# Patient Record
Sex: Female | Born: 1935
Health system: Southern US, Community
[De-identification: ages and names within clinical notes are randomized; demographics above are authoritative.]

## PROBLEM LIST (undated history)

## (undated) DIAGNOSIS — K589 Irritable bowel syndrome without diarrhea: Secondary | ICD-10-CM

## (undated) DIAGNOSIS — M419 Scoliosis, unspecified: Secondary | ICD-10-CM

## (undated) DIAGNOSIS — K5790 Diverticulosis of intestine, part unspecified, without perforation or abscess without bleeding: Secondary | ICD-10-CM

## (undated) DIAGNOSIS — I10 Essential (primary) hypertension: Secondary | ICD-10-CM

## (undated) DIAGNOSIS — E669 Obesity, unspecified: Secondary | ICD-10-CM

## (undated) DIAGNOSIS — M1990 Unspecified osteoarthritis, unspecified site: Secondary | ICD-10-CM

## (undated) DIAGNOSIS — H9190 Unspecified hearing loss, unspecified ear: Secondary | ICD-10-CM

## (undated) DIAGNOSIS — M8580 Other specified disorders of bone density and structure, unspecified site: Secondary | ICD-10-CM

## (undated) DIAGNOSIS — K219 Gastro-esophageal reflux disease without esophagitis: Secondary | ICD-10-CM

## (undated) HISTORY — PX: NISSEN FUNDOPLICATION: SHX2091

## (undated) HISTORY — PX: ABDOMINAL HERNIA REPAIR: SHX539

## (undated) HISTORY — PX: TOTAL ABDOMINAL HYSTERECTOMY: SHX209

## (undated) HISTORY — DX: Irritable bowel syndrome, unspecified: K58.9

## (undated) HISTORY — DX: Unspecified osteoarthritis, unspecified site: M19.90

## (undated) HISTORY — DX: Other specified disorders of bone density and structure, unspecified site: M85.80

## (undated) HISTORY — DX: Gastro-esophageal reflux disease without esophagitis: K21.9

## (undated) HISTORY — DX: Diverticulosis of intestine, part unspecified, without perforation or abscess without bleeding: K57.90

## (undated) HISTORY — DX: Essential (primary) hypertension: I10

## (undated) HISTORY — PX: CATARACT EXTRACTION: SUR2

## (undated) HISTORY — DX: Scoliosis, unspecified: M41.9

## (undated) HISTORY — PX: BREAST BIOPSY: SHX20

## (undated) HISTORY — DX: Obesity, unspecified: E66.9

## (undated) HISTORY — PX: APPENDECTOMY: SHX54

## (undated) HISTORY — DX: Unspecified hearing loss, unspecified ear: H91.90

## (undated) HISTORY — PX: COLON RESECTION: SHX5231

## (undated) HISTORY — PX: HEMORRHOID SURGERY: SHX153

---

## 1998-08-04 ENCOUNTER — Encounter: Payer: Self-pay | Admitting: Family Medicine

## 1998-09-16 ENCOUNTER — Ambulatory Visit (HOSPITAL_COMMUNITY): Admission: RE | Admit: 1998-09-16 | Discharge: 1998-09-16 | Payer: Self-pay | Admitting: Family Medicine

## 1998-09-16 ENCOUNTER — Encounter: Payer: Self-pay | Admitting: Cardiology

## 1998-10-03 ENCOUNTER — Inpatient Hospital Stay (HOSPITAL_COMMUNITY): Admission: RE | Admit: 1998-10-03 | Discharge: 1998-10-06 | Payer: Self-pay | Admitting: General Surgery

## 1999-03-04 ENCOUNTER — Other Ambulatory Visit: Admission: RE | Admit: 1999-03-04 | Discharge: 1999-03-04 | Payer: Self-pay | Admitting: Gynecology

## 1999-10-12 ENCOUNTER — Encounter: Admission: RE | Admit: 1999-10-12 | Discharge: 1999-10-12 | Payer: Self-pay | Admitting: *Deleted

## 1999-10-12 ENCOUNTER — Encounter: Payer: Self-pay | Admitting: *Deleted

## 1999-11-11 ENCOUNTER — Ambulatory Visit (HOSPITAL_COMMUNITY): Admission: RE | Admit: 1999-11-11 | Discharge: 1999-11-11 | Payer: Self-pay | Admitting: *Deleted

## 1999-11-11 ENCOUNTER — Encounter: Payer: Self-pay | Admitting: *Deleted

## 2000-03-04 ENCOUNTER — Other Ambulatory Visit: Admission: RE | Admit: 2000-03-04 | Discharge: 2000-03-04 | Payer: Self-pay | Admitting: Gynecology

## 2000-10-27 ENCOUNTER — Ambulatory Visit: Admission: RE | Admit: 2000-10-27 | Discharge: 2000-10-27 | Payer: Self-pay | Admitting: Family Medicine

## 2001-03-06 ENCOUNTER — Other Ambulatory Visit: Admission: RE | Admit: 2001-03-06 | Discharge: 2001-03-06 | Payer: Self-pay | Admitting: Gynecology

## 2001-03-20 ENCOUNTER — Ambulatory Visit (HOSPITAL_COMMUNITY): Admission: RE | Admit: 2001-03-20 | Discharge: 2001-03-20 | Payer: Self-pay | Admitting: Family Medicine

## 2001-03-20 ENCOUNTER — Encounter: Payer: Self-pay | Admitting: Family Medicine

## 2002-01-17 ENCOUNTER — Encounter: Payer: Self-pay | Admitting: Gynecology

## 2002-01-17 ENCOUNTER — Encounter: Admission: RE | Admit: 2002-01-17 | Discharge: 2002-01-17 | Payer: Self-pay | Admitting: Gynecology

## 2002-02-26 ENCOUNTER — Encounter: Payer: Self-pay | Admitting: Family Medicine

## 2002-02-26 ENCOUNTER — Ambulatory Visit (HOSPITAL_COMMUNITY): Admission: RE | Admit: 2002-02-26 | Discharge: 2002-02-26 | Payer: Self-pay | Admitting: Family Medicine

## 2002-04-02 ENCOUNTER — Other Ambulatory Visit: Admission: RE | Admit: 2002-04-02 | Discharge: 2002-04-02 | Payer: Self-pay | Admitting: Gynecology

## 2002-04-19 ENCOUNTER — Encounter (INDEPENDENT_AMBULATORY_CARE_PROVIDER_SITE_OTHER): Payer: Self-pay | Admitting: Specialist

## 2002-04-19 ENCOUNTER — Ambulatory Visit (HOSPITAL_COMMUNITY): Admission: RE | Admit: 2002-04-19 | Discharge: 2002-04-19 | Payer: Self-pay | Admitting: Gastroenterology

## 2002-06-11 ENCOUNTER — Encounter: Payer: Self-pay | Admitting: Gastroenterology

## 2002-06-11 ENCOUNTER — Encounter: Admission: RE | Admit: 2002-06-11 | Discharge: 2002-06-11 | Payer: Self-pay | Admitting: Gastroenterology

## 2002-06-13 ENCOUNTER — Ambulatory Visit (HOSPITAL_COMMUNITY): Admission: RE | Admit: 2002-06-13 | Discharge: 2002-06-13 | Payer: Self-pay | Admitting: Gastroenterology

## 2002-06-13 ENCOUNTER — Encounter (INDEPENDENT_AMBULATORY_CARE_PROVIDER_SITE_OTHER): Payer: Self-pay | Admitting: Specialist

## 2003-02-12 ENCOUNTER — Encounter: Admission: RE | Admit: 2003-02-12 | Discharge: 2003-02-12 | Payer: Self-pay | Admitting: Gynecology

## 2003-03-12 ENCOUNTER — Ambulatory Visit (HOSPITAL_COMMUNITY): Admission: RE | Admit: 2003-03-12 | Discharge: 2003-03-12 | Payer: Self-pay | Admitting: Family Medicine

## 2003-07-03 ENCOUNTER — Ambulatory Visit (HOSPITAL_COMMUNITY): Admission: RE | Admit: 2003-07-03 | Discharge: 2003-07-03 | Payer: Self-pay | Admitting: Family Medicine

## 2004-05-06 ENCOUNTER — Ambulatory Visit (HOSPITAL_COMMUNITY): Admission: RE | Admit: 2004-05-06 | Discharge: 2004-05-06 | Payer: Self-pay | Admitting: Gastroenterology

## 2004-05-06 ENCOUNTER — Encounter (INDEPENDENT_AMBULATORY_CARE_PROVIDER_SITE_OTHER): Payer: Self-pay | Admitting: Specialist

## 2005-03-25 ENCOUNTER — Other Ambulatory Visit: Admission: RE | Admit: 2005-03-25 | Discharge: 2005-03-25 | Payer: Self-pay | Admitting: Gynecology

## 2006-03-29 ENCOUNTER — Encounter: Admission: RE | Admit: 2006-03-29 | Discharge: 2006-03-29 | Payer: Self-pay | Admitting: Gynecology

## 2006-07-16 ENCOUNTER — Observation Stay (HOSPITAL_COMMUNITY): Admission: EM | Admit: 2006-07-16 | Discharge: 2006-07-17 | Payer: Self-pay | Admitting: Emergency Medicine

## 2007-04-13 ENCOUNTER — Encounter: Admission: RE | Admit: 2007-04-13 | Discharge: 2007-04-13 | Payer: Self-pay | Admitting: Family Medicine

## 2008-04-15 ENCOUNTER — Encounter: Admission: RE | Admit: 2008-04-15 | Discharge: 2008-04-15 | Payer: Self-pay | Admitting: Family Medicine

## 2009-04-16 ENCOUNTER — Encounter: Admission: RE | Admit: 2009-04-16 | Discharge: 2009-04-16 | Payer: Self-pay | Admitting: Family Medicine

## 2009-05-01 ENCOUNTER — Encounter: Admission: RE | Admit: 2009-05-01 | Discharge: 2009-05-01 | Payer: Self-pay | Admitting: Family Medicine

## 2009-06-26 ENCOUNTER — Encounter: Admission: RE | Admit: 2009-06-26 | Discharge: 2009-06-26 | Payer: Self-pay | Admitting: Family Medicine

## 2010-04-12 ENCOUNTER — Encounter: Payer: Self-pay | Admitting: Family Medicine

## 2010-08-07 NOTE — Op Note (Signed)
NAME:  Connie Obrien, Connie Obrien               ACCOUNT NO.:  [id number]   MEDICAL RECORD NO.:  [id number]          PATIENT TYPE:  AMB   LOCATION:  ENDO                         FACILITY:  [facility]   PHYSICIAN:  Anselmo Rod, M.D.  DATE OF BIRTH:  Nov 26, 1919   DATE OF PROCEDURE:  05/06/2004  DATE OF DISCHARGE:                                 OPERATIVE REPORT   PROCEDURE PERFORMED:  Colonoscopy with snare polypectomy x1 and cold  biopsies x3.   ENDOSCOPIST:  Anselmo Rod, M.D.   INSTRUMENT USED:  Olympus video colonoscope.   INDICATION FOR PROCEDURE:  An 77 year old white female with a history of  left lower quadrant pain and guaiac-positive stools.  Rule out colonic  polyps, masses, etc.  The patient has had a sigmoid colectomy for  complicated diverticulitis.   PREPROCEDURE PREPARATION:  Informed consent was procured from the patient.  The patient was fasted for eight hours prior to the procedure and prepped  with a bottle of magnesium citrate and a gallon of GoLYTELY the night prior  to the procedure.  The risks and benefits of the procedure, including a 10%  miss rate of cancer and polyps, were discussed with the patient as well.   PREPROCEDURE PHYSICAL:  VITAL SIGNS:  The patient had stable vital signs.  NECK:  Supple.  CHEST:  Clear to auscultation.  S1, S2 regular.  ABDOMEN:  Obese with left lower quadrant tenderness on palpation with  guarding, no rebound or rigidity.  No hepatosplenomegaly.   DESCRIPTION OF PROCEDURE:  The patient was placed in the left lateral  decubitus position and sedated with 50 mg of Demerol and 5 mg of Versed.  Once the patient was adequately sedate and maintained on low-flow oxygen and  continuous cardiac monitoring, the Olympus video colonoscope was advanced  from the rectum to the cecum.  A healthy anastomosis was noted in the left  colon at about 20 cm.  There was evidence of pandiverticulosis with  diverticula of varying sizes throughout the colon.   A small polyp was snared  from the distal right colon but was lost in the stool.  There was a  significant amount of residual stool in the colon.  Three small sessile  polyps were biopsied from the cecum (cold biopsies).  Small internal  hemorrhoids were seen on retroflexion.  The patient tolerated the procedure  well  without immediate complications.   IMPRESSION:  1.  Pandiverticulosis with a healthy anastomosis at 20 cm.  2.  Three small sessile polyps biopsied from the cecum and another polyp      snared from distal right colon.  The polyp that was snared was lost in      stool.  3.  Small internal hemorrhoids.  4.  A large amount of residual stool in the colon, small lesions could have      been missed.   RECOMMENDATIONS:  1.  Await pathology results.  2.  Avoid all nonsteroidals, including aspirin, for the next four weeks.  3.  Outpatient follow-up in the next two weeks for further recommendations.  4.  A high-fiber diet and brochures on diverticulosis are being given to the      patient for her education.      JNM/MEDQ  D:  05/06/2004  T:  05/06/2004  Job:  [id number]   cc:   Stacie Acres. White, M.D.  [address]  [place]  [place] [address]  Fax: [phone]

## 2010-08-07 NOTE — Op Note (Signed)
   NAME:  Brunelli, Doninique E                         ACCOUNT NO.:  [id number]   MEDICAL RECORD NO.:  [id number]                   PATIENT TYPE:  AMB   LOCATION:  ENDO                                 FACILITY:  [facility]   PHYSICIAN:  Anselmo Rod, M.D.               DATE OF BIRTH:  04-03-1919   DATE OF PROCEDURE:  06/13/2002  DATE OF DISCHARGE:                                 OPERATIVE REPORT   ADDENDUM:  Mrs. Hartwell had severe gastritis on examination of the stomach.  Retroflexion of the high cardia revealed evidence of some surgical changes  at the GE junction, question fundoplication (however, no definite  information on the surgical procedure available to me at this time).                                                Anselmo Rod, M.D.    JNM/MEDQ  D:  06/13/2002  T:  06/14/2002  Job:  [id number]

## 2010-08-07 NOTE — Op Note (Signed)
NAME:  Ketchem, Joeanne E                         ACCOUNT NO.:  [id number]   MEDICAL RECORD NO.:  [id number]                   PATIENT TYPE:  AMB   LOCATION:  ENDO                                 FACILITY:  [facility]   PHYSICIAN:  Anselmo Rod, M.D.               DATE OF BIRTH:  Nov 09, 1919   DATE OF PROCEDURE:  04/19/2002  DATE OF DISCHARGE:                                 OPERATIVE REPORT   PROCEDURE:  Colonoscopy with hot biopsy x1.   ENDOSCOPIST:  Anselmo Rod, M.D.   INSTRUMENT USED:  Pediatric adjustable Olympus colonoscope.   INDICATION FOR PROCEDURE:  An 77 year old white female undergoing screening  colonoscopy.  The patient is status post sigmoid colectomy for complicated  diverticular disease done in the past.   PREPROCEDURE PREPARATION:  Informed consent was procured from the patient.  The patient was fasted for eight hours prior to the procedure and prepped  with a bottle of Miralax and Gatorade the night prior to the procedure.   PREPROCEDURE PHYSICAL:  VITAL SIGNS:  The patient had stable vital signs.  NECK:  Supple.  CHEST:  Clear to auscultation.  S1, S2 regular.  ABDOMEN:  Soft with normal bowel sounds.   DESCRIPTION OF PROCEDURE:  The patient was placed in the left lateral  decubitus position and sedated with 40 mg of Demerol and 4 mg of Versed  intravenously.  Once the patient was adequately sedate and maintained on low-  flow oxygen and continuous cardiac monitoring, the Olympus video colonoscope  was advanced from the rectum to the cecum with difficulty.  There was a  large amount of residual stool in the colon with some solid stool as well.  A healthy anastomosis was noted at 10 cm.  Small internal hemorrhoids were  seen on retroflexion.  There was scattered diverticular disease seen  proximal to the anastomosis.  A small sessile polyp was hot biopsied from  100 cm.   IMPRESSION:  1. Scattered diverticulosis (few diverticula).  2. Healthy anastomosis  at 10 cm.  3. Small, nonbleeding internal hemorrhoids on retroflexion.  4. Small polyp at 100 cm, removed by hot biopsy.  5. Large amount of residual stool in the colon.  Small lesions could have     been missed.   RECOMMENDATIONS:  1. Await pathology results.  2.     A high-fiber diet.  3. Outpatient follow-up on a p.r.n. basis.  4. Repeat colonoscopy depending on pathology report.                                               Anselmo Rod, M.D.    JNM/MEDQ  D:  04/19/2002  T:  04/19/2002  Job:  [id number]   cc:   Stacie Acres. White, M.D.  [phone]  [address]  [place]  [place] [address]  Fax: [phone]

## 2010-08-07 NOTE — Op Note (Signed)
NAME:  Connie Obrien, Connie Obrien                         ACCOUNT NO.:  [id number]   MEDICAL RECORD NO.:  [id number]                   PATIENT TYPE:  AMB   LOCATION:  ENDO                                 FACILITY:   PHYSICIAN:  Anselmo Rod, M.D.               DATE OF BIRTH:  January 08, 1920   DATE OF PROCEDURE:  06/13/2002  DATE OF DISCHARGE:                                 OPERATIVE REPORT   PROCEDURE:  Esophagogastroduodenoscopy with biopsies.   ENDOSCOPIST:  Charna Elizabeth, M.D.   INSTRUMENT USED:  Olympus video panendoscope.   INDICATIONS FOR PROCEDURE:  Dysphagia and a sense of choking in an 77-year-  old white female.  The patient has had some improvement of her symptoms with  b.i.d. PPI (Nexium).  Rule out esophageal stricture, ulcers, masses, etc.   PREPROCEDURE PREPARATION:  Informed consent was procured from the patient.  The patient was fasted for eight hours prior to the procedure.   PREPROCEDURE PHYSICAL:  VITAL SIGNS:  The patient had stable vital signs.  NECK:  Supple.  CHEST:  Clear to auscultation.  CARDIAC:  S1 and S2 regular.  ABDOMEN:  Soft with normal bowel sounds.   DESCRIPTION OF PROCEDURE:  The patient was placed in the left lateral  decubitus position and sedated with 40 mg of Demerol and 4 mg of Versed  intravenously.  Once the patient was adequately sedated, maintained on low  flow oxygen and continuous cardiac monitoring, the Olympus video  panendoscope was advance through the mouth piece over the tongue, into the  esophagus under direct vision.  There was no evidence of esophagitis.  Contractions did not seem to progress throughout the esophagus as would be  expected.  There seemed to be some element of dysmotility but on advancing  the scope into the stomach, there was evidence of severe gastritis  throughout the gastric mucosa with significant erythema.  Multiple biopsies  were done to rule out presence of H pylori forwarded to pathology.  These  changes  seemed to be very prominent in the proximal half of the stomach.  The antrum appeared normal.  At the duodenal bulb and the proximal  ___________ appeared normal as well.   IMPRESSION:  1. Normal appearing esophagus except for some element of dysmotility.  2. Severe gastritis in the proximal half of the stomach.  Biopsies done,     results pending.  3. Normal proximal small bowel.   RECOMMENDATIONS:  1. Continue Nexium b.i.d.  2.     Treat with antibiotics if H pylori present.  3. Esophageal manometry if dysphagia persists.  4. Outpatient followup in the next two weeks for further recommendations.  Anselmo Rod, M.D.    JNM/MEDQ  D:  06/13/2002  T:  06/14/2002  Job:  [id number]   cc:   Stacie Acres. White, M.D.  [address]  [place]  [place] [address]  Fax: [phone]

## 2010-08-07 NOTE — H&P (Signed)
NAME:  Connie Obrien, Connie Obrien               ACCOUNT NO.:  [id number]   MEDICAL RECORD NO.:  [id number]          PATIENT TYPE:  INP   LOCATION:  1825                         FACILITY:  [facility]   PHYSICIAN:  Kela Millin, M.D.DATE OF BIRTH:  1919-06-12   DATE OF ADMISSION:  07/16/2006  DATE OF DISCHARGE:                              HISTORY & PHYSICAL   CHIEF COMPLAINT:  Dyspnea on exertion and chest pain.   HISTORY OF PRESENT ILLNESS:  The patient is an 77 year old white female  with a past medical history significant for hypertension (?), GERD and  venous insufficiency, who presents with complaints of dyspnea on  exertion as well as chest discomfort that has worsened in the past one  week.  She states that for several years she has had these symptoms -  when she walks up any incline she has to stop to catch her breath and  she has been having a constant chest pressure for this same duration  that seems to have worsened, as well.  She admits to a cough that is  nonproductive and recently she has been having a lot of foamy sputum  production.  She denies nausea, vomiting or diaphoresis, and no  radiation.  She also denies fevers, dysuria, melena or diarrhea, and no  hematochezia.  Her chest discomfort is midsternal in location.   The patient was seen in the ER and a chest x-ray was negative for acute  infiltrates.  A hiatal hernia was noted.  Her point of care markers were  negative.  Her beta natriuretic peptide was noted to be within normal  limits at 53.  The patient also reports easy fatigability.  She denies  PND or orthopnea.  She has had some long-standing lower extremity edema  - had vein stripping done in the 1970's and has a history of varicose  veins, as well.   PAST MEDICAL HISTORY:  1. As stated above.  2. History of diverticulosis.  3. History of phlebitis.   MEDICATIONS:  1. Aspirin.  2. Claritin 10 mg daily.  3. Lasix 40 mg daily.  4. Protonix 40 mg daily.  5.  Xopenex.  6. Metoprolol 50 mg b.i.d.  7. Citracal calcium 2 b.i.d.  8. Vitamin D.  9. Normal saline spray p.r.n.  10.Astelin.  11.Asmanex Twisthaler 220 mcg.  12.Travatan 0.04 solution 1 drop bilaterally q.h.s.  13.Alphagan 0.1% solution 1 drop b.i.d.   ALLERGIES:  SULFA.   SOCIAL HISTORY:  She denies tobacco.  She also denies alcohol.   FAMILY HISTORY:  Her mother is deceased; had a MI at age 38.   REVIEW OF SYSTEMS:  As per HPI; other review of systems is negative.   PHYSICAL EXAMINATION:  GENERAL:  The patient is a pleasant, elderly,  obese white female.  She is alert and oriented, in no apparent distress.  VITAL SIGNS:  Blood pressure is 129/65, initially was 170/92.  Her pulse  is 69, respiratory rate is 20 and O2 sat is 97%.  HEENT:  PERRL, EOMI.  Moist mucous membranes.  No oral exudates.  NECK:  Supple.  No  adenopathy.  No JVD.  No thyromegaly.  LUNGS:  Clear to auscultation bilaterally.  No crackles or wheezes.  CARDIOVASCULAR:  Regular rate and rhythm.  Normal S1 and S2.  No S3  appreciated.  ABDOMEN:  Obese.  Soft.  Bowel sounds present.  Nontender.  Nondistended.  No organomegaly and no masses palpable.  EXTREMITIES:  +1 to 2 edema.  No cyanosis.  NEUROLOGIC:  Alert and oriented times three.  Cranial nerves II through  XII are grossly intact.  Nonfocal exam.   LABORATORY DATA:  Chest x-ray - cardiomegaly, no acute abnormality,  hiatal hernia noted, and elevated left hemidiaphragm.   Beta natriuretic peptide is 53.  Point of care markers are negative  times one.  Sodium is 143, potassium is 4.5, chloride is 105, CO2 is 32,  glucose is 97, BUN is 12 and creatinine is 0.91.  Hemoglobin is 14.6,  hematocrit is 43 and white cell count  is pending.   ASSESSMENT AND PLAN:  1. Chest pain/dyspnea on exertion - as discussed above, will obtain      serial cardiac enzymes and D-dimer, and follow.  Will place the      patient on aspirin, nitrates and continue beta-blocker.   Will also      obtain a TSH and follow.  Will place the patient on a proton pump      inhibitor to cover for gastroesophageal reflux      disease/gastrointestinal - as noted above, patient with a hiatal      hernia on chest x-ray.  2. Bronchitis - as above, will place on antitussive/mucolytics.  She      is afebrile and chest x-ray is negative for acute infiltrates.  No      indication for antibiotics at this time.  Will follow and manage as      appropriate.  3. Hypertension - monitor blood pressure, continue metoprolol.  4. Venous insufficiency/peripheral edema - continue Lasix.  Patient      denies a history of congestive heart failure and her beta      natriuretic peptide is within normal limits at 53.  5. History of allergic rhinitis - continue outpatient medications.      Kela Millin, M.D.  Electronically Signed     ACV/MEDQ  D:  07/16/2006  T:  07/16/2006  Job:  [id number]   cc:   Stacie Acres. Cliffton Asters, M.D.

## 2010-08-25 ENCOUNTER — Encounter: Payer: Self-pay | Admitting: Internal Medicine

## 2010-08-26 ENCOUNTER — Ambulatory Visit (INDEPENDENT_AMBULATORY_CARE_PROVIDER_SITE_OTHER): Payer: Medicare Other | Admitting: Internal Medicine

## 2010-08-26 ENCOUNTER — Encounter: Payer: Self-pay | Admitting: Internal Medicine

## 2010-08-26 VITALS — BP 130/68 | HR 71 | Temp 97.4°F | Ht 61.0 in | Wt 199.2 lb

## 2010-08-26 DIAGNOSIS — R05 Cough: Secondary | ICD-10-CM

## 2010-08-26 DIAGNOSIS — R0989 Other specified symptoms and signs involving the circulatory and respiratory systems: Secondary | ICD-10-CM

## 2010-08-26 DIAGNOSIS — R059 Cough, unspecified: Secondary | ICD-10-CM

## 2010-08-26 DIAGNOSIS — R0600 Dyspnea, unspecified: Secondary | ICD-10-CM

## 2010-08-26 NOTE — Progress Notes (Signed)
Subjective:    Patient ID: Connie Obrien, female    DOB: Nov 13, 1919, 77 y.o.   MRN: [id number]  Cough This is a new (77 year old obese female. Says she has "hacking cough" all winter - since Oct 2011. Passive smoker +) problem. The current episode started more than 1 month ago (Started in Oct 2011). The problem has been waxing and waning (Waxing and waning. 4 episodes since Oct 2011 that are worrse. In between episodes, also has cough. Each episode last 1-2 weeks; most recent episode is thiese past 2 weeks but been getting better since 08/22/2010). The problem occurs constantly. The cough is productive of sputum (Episodes of cough are associated with yellow white sputum that is getting better.  Between episodes has only drry cough). Associated symptoms include heartburn, postnasal drip, a sore throat and shortness of breath. Pertinent negatives include no chest pain, chills, ear congestion, ear pain, eye redness, fever, headaches, hemoptysis, myalgias, nasal congestion, rash, rhinorrhea, sweats, weight loss or wheezing. Associated symptoms comments: Gags +, Regurgitates food +. Hiatal hernina - 2 surgeris 1917 and 1973 but CXR 04/2009 still shows it. Known GERD + with increased heartburn past 2 weeks; can taste food. Followed by Dr. Loreta Ave. Occ postnasal drip past 2 weeks. On 08/11/2010 PMD changed ACE inhibitor that she had been on for several years to ARB losartan but she so far has not noticed if this has helped but been only 2 weeks. Also, c/o dyspnea . Currently on fish oil for years too. The symptoms are aggravated by animals, lying down, pollens, fumes, exercise and dust (Triggered by reading newspaper, or reading any print, fumes, pollen, dust (known allergies),  cats, ). Risk factors for lung disease include smoking/tobacco exposure (Passive smoking via husband [phone]  when he diedd). She has tried prescription cough suppressant (zyrtec, flonase, astepro, PPI - helping somewhat only but not worse) for  the symptoms. The treatment provided mild relief. Her past medical history is significant for asthma and environmental allergies. There is no history of bronchiectasis, bronchitis, COPD, emphysema or pneumonia. diagnosed as asthma (occupational bleach exposure in 1971( in 1971. Dx superficial phlebitis but not DVT in 1970s  Shortness of Breath This is a new problem. The current episode started more than 1 year ago (few years). The problem occurs intermittently. The problem has been gradually worsening. Associated symptoms include leg swelling and a sore throat. Pertinent negatives include no chest pain, ear pain, fever, headaches, hemoptysis, rash, rhinorrhea, vomiting or wheezing. The symptoms are aggravated by exercise, pollens, odors, smoke, fumes, any activity and animal exposure (walking 100 feet produces dyspnea). Risk factors: obesity. She has tried nothing for the symptoms. The treatment provided no relief. Her past medical history is significant for allergies and asthma. There is no history of CAD, chronic lung disease, COPD, DVT, a heart failure, PE, pneumonia or a recent surgery. (Diagnosed as asthma (occupational bleach exposure in 1971( in 1971. Dx superficial phlebitis but not DVT in 1970s)   I also looked at CXR from 03/30/2010 report - confirms image of feb 2011 - left diaphragm elevation with hiatal hernia     Review of Systems  Constitutional: Negative for fever, chills, weight loss and unexpected weight change.  HENT: Positive for sore throat, sneezing, trouble swallowing and postnasal drip. Negative for ear pain, nosebleeds, congestion, rhinorrhea, dental problem and sinus pressure.   Eyes: Negative for redness and itching.  Respiratory: Positive for cough and shortness of breath. Negative for hemoptysis, chest tightness and wheezing.  Cardiovascular: Positive for leg swelling. Negative for chest pain and palpitations.  Gastrointestinal: Positive for heartburn. Negative for nausea  and vomiting.  Genitourinary: Negative for dysuria.  Musculoskeletal: Positive for joint swelling. Negative for myalgias.  Skin: Negative for rash.  Neurological: Negative for headaches.  Hematological: Positive for environmental allergies. Does not bruise/bleed easily.  Psychiatric/Behavioral: Negative for dysphoric mood. The patient is not nervous/anxious.        Objective:   Physical Exam  Vitals reviewed. Constitutional: She is oriented to person, place, and time. She appears well-developed and well-nourished. No distress.       obese  HENT:  Head: Normocephalic and atraumatic.  Right Ear: External ear normal.  Left Ear: External ear normal.  Mouth/Throat: Oropharynx is clear and moist. No oropharyngeal exudate.  Eyes: Conjunctivae and EOM are normal. Pupils are equal, round, and reactive to light. Right eye exhibits no discharge. Left eye exhibits no discharge. No scleral icterus.  Neck: Normal range of motion. Neck supple. No JVD present. No tracheal deviation present. No thyromegaly present.  Cardiovascular: Normal rate, regular rhythm, normal heart sounds and intact distal pulses.  Exam reveals no gallop and no friction rub.   No murmur heard. Pulmonary/Chest: Effort normal and breath sounds normal. No respiratory distress. She has no wheezes. She has no rales. She exhibits no tenderness.  Abdominal: Soft. Bowel sounds are normal. She exhibits no distension and no mass. There is no tenderness. There is no rebound and no guarding.  Musculoskeletal: Normal range of motion. She exhibits no edema and no tenderness.  Lymphadenopathy:    She has no cervical adenopathy.  Neurological: She is alert and oriented to person, place, and time. She has normal reflexes. No cranial nerve deficit. She exhibits normal muscle tone. Coordination normal.  Skin: Skin is warm and dry. No rash noted. She is not diaphoretic. No erythema. No pallor.  Psychiatric: She has a normal mood and affect. Her  behavior is normal. Judgment and thought content normal.          Assessment & Plan:

## 2010-08-26 NOTE — Patient Instructions (Addendum)
#  Cough is from multiple reasons - bp pill lisinopril, sinus, acid reflux, possibly asthma and possibly cyclical cough Stop fish oil Contact Dr Loreta Ave for hiatal hernia and acid reflux issues Finish prednisone tomorrow Have methacholine challenge test in 2-3 weeks #Shortness of breath  - my nurse will walk you for oxygen levels (reviewed after she left: noted she di #Followup  - after methacholine chaleng test

## 2010-08-28 ENCOUNTER — Encounter: Payer: Self-pay | Admitting: Internal Medicine

## 2010-08-28 DIAGNOSIS — R059 Cough, unspecified: Secondary | ICD-10-CM | POA: Insufficient documentation

## 2010-08-28 DIAGNOSIS — R05 Cough: Secondary | ICD-10-CM | POA: Insufficient documentation

## 2010-08-28 DIAGNOSIS — R0600 Dyspnea, unspecified: Secondary | ICD-10-CM | POA: Insufficient documentation

## 2010-08-28 NOTE — Assessment & Plan Note (Addendum)
Cough is from multiple reasons - bp pill lisinopril whose effect is still not worn off and typically needs 4 weeks to wear off, sinus, acid reflux, possibly asthma and possibly cyclical cough   Plan Need to give few more weeks to complete lisinopril wash out Needs to very importantly address possible GERD: Stop fish oil to avoid GERD precipiation and Contact Dr Loreta Ave for hiatal hernia and acid reflux issues Finish prednisone tomorrow Have methacholine challenge test in 2-3 weeks to rule out asthma

## 2010-08-28 NOTE — Assessment & Plan Note (Signed)
Probably multifactorial from obesity and possibly underlyng asthma made worse by lisinopril. Need to start workup by ruling in/out asthma with methacholine challenge test. Of note, she did not desaturate with exertion

## 2010-09-16 ENCOUNTER — Ambulatory Visit (HOSPITAL_COMMUNITY)
Admission: RE | Admit: 2010-09-16 | Discharge: 2010-09-16 | Disposition: A | Discharge status: Home / Self Care | Payer: Medicare Other | Source: Ambulatory Visit | Attending: Internal Medicine | Admitting: Internal Medicine

## 2010-09-16 DIAGNOSIS — R05 Cough: Secondary | ICD-10-CM

## 2010-09-16 DIAGNOSIS — R059 Cough, unspecified: Secondary | ICD-10-CM

## 2010-09-22 ENCOUNTER — Encounter: Payer: Self-pay | Admitting: Internal Medicine

## 2010-09-22 ENCOUNTER — Ambulatory Visit (INDEPENDENT_AMBULATORY_CARE_PROVIDER_SITE_OTHER): Payer: Medicare Other | Admitting: Internal Medicine

## 2010-09-22 VITALS — BP 122/70 | HR 88 | Temp 97.9°F | Ht 60.0 in | Wt 200.6 lb

## 2010-09-22 DIAGNOSIS — R0602 Shortness of breath: Secondary | ICD-10-CM

## 2010-09-22 DIAGNOSIS — R0600 Dyspnea, unspecified: Secondary | ICD-10-CM

## 2010-09-22 DIAGNOSIS — R05 Cough: Secondary | ICD-10-CM

## 2010-09-22 MED ORDER — AEROCHAMBER PLUS MISC: Status: AC

## 2010-09-22 NOTE — Progress Notes (Signed)
Subjective:    Patient ID: Connie Obrien, female    DOB: 1919/04/22, 77 y.o.   MRN: [id number]  HPI  OV 09/22/2010: Followup cough (lsinopril, gerd/hiatal hernia), and  dyspnea. Since last visit 08/26/2010 - has given > 4 weeks time for lisinopril to wash out . Also, stopped fish oil and saw Dr Loreta Ave on 09/21/2010: notes revied. She has contniued PPI but has been asked to stop VIt C and GAS X. With above, dry cough now 90% better. Unclear what aggravating or relieving factors for the remainder 10% of cough but denies sinus drainage. But methachioline challenge test 09/16/2010 (> 4 week ace inhibitor wwash out) is POSITIVE for ASthma (PD20 is 4). Dyspnea on exertion relieved by rest persists unchanged at class 2-3 levels. There are no other new associated features in interim.   Past Medical History  Diagnosis Date  . Obesity   . Osteoarthritis   . Scoliosis   . Glaucoma   . Deaf   . Allergic rhinitis   . Diverticulosis   . Asthma   . HTN (hypertension)   . Osteopenia   . GERD (gastroesophageal reflux disease)   . IBS (irritable bowel syndrome)      Family History  Problem Relation Age of Onset  . Coronary artery disease Father   . Diabetes Father   . Coronary artery disease Mother   . Coronary artery disease Brother   . Prostate cancer Brother     x2     History   Social History  . Marital Status: Widowed    Spouse Name: N/A    Number of Children: 8  . Years of Education: N/A   Occupational History  . retired from [employer]    Social History Main Topics  . Smoking status: Never Smoker   . Smokeless tobacco: Not on file  . Alcohol Use: No  . Drug Use: No  . Sexually Active: Not on file   Other Topics Concern  . Not on file   Social History Narrative  . No narrative on file     Allergies  Allergen Reactions  . Septra (Bactrim)   . Sulfa Antibiotics      Outpatient Prescriptions Prior to Visit  Medication Sig Dispense Refill  . aspirin 81 MG tablet Take 81  mg by mouth daily.        . Azelastine HCl (ASTEPRO) 0.15 % SOLN Place 2 puffs into the nose daily.        . benzonatate (TESSALON) 100 MG capsule Take 100 mg by mouth 3 (three) times daily as needed.        . bimatoprost (LUMIGAN) 0.03 % ophthalmic solution Place 1 drop into both eyes at bedtime.        . calcium citrate-vitamin D (CITRACAL+D) 315-200 MG-UNIT per tablet Take 1 tablet by mouth 2 (two) times daily.        . cefUROXime (CEFTIN) 500 MG tablet Take 500 mg by mouth 2 (two) times daily.        . cetirizine (ZYRTEC) 10 MG tablet Take 10 mg by mouth daily.        . Clobetasol Prop Crea-Coal Tar 0.05 & 2.3 % KIT Apply topically.        Marland Kitchen estradiol (ESTRACE) 0.1 MG/GM vaginal cream Place 2 g vaginally 2 (two) times a week.        . fluticasone (FLONASE) 50 MCG/ACT nasal spray Place 2 sprays into the nose daily.        Marland Kitchen  losartan-hydrochlorothiazide (HYZAAR) 100-25 MG per tablet Take 1 tablet by mouth daily.        . pantoprazole (PROTONIX) 40 MG tablet Take 40 mg by mouth daily.        . pravastatin (PRAVACHOL) 40 MG tablet Take 40 mg by mouth daily.        . timolol (BETIMOL) 0.5 % ophthalmic solution Place 1 drop into both eyes daily.        . vitamin D, CHOLECALCIFEROL, 400 UNITS tablet Take 400 Units by mouth daily.        . vitamin E 400 UNIT capsule Take 400 Units by mouth daily.        . Ascorbic Acid (VITAMIN C WITH ROSE HIPS) 100 MG tablet Take 100 mg by mouth daily.        . predniSONE (DELTASONE) 20 MG tablet Take 3 x3, then 2 x3, then 1 x 3.       . simethicone (MYLICON) 80 MG chewable tablet Chew 80 mg by mouth every 6 (six) hours as needed.           Review of Systems  Constitutional: Negative for fever and unexpected weight change.  HENT: Negative for ear pain, nosebleeds, congestion, sore throat, rhinorrhea, sneezing, trouble swallowing, dental problem, postnasal drip and sinus pressure.   Eyes: Negative for redness and itching.  Respiratory: Positive for cough.  Negative for chest tightness, shortness of breath and wheezing.   Cardiovascular: Negative for palpitations and leg swelling.  Gastrointestinal: Negative for nausea and vomiting.  Genitourinary: Negative for dysuria.  Musculoskeletal: Negative for joint swelling.  Skin: Negative for rash.  Neurological: Negative for headaches.  Hematological: Does not bruise/bleed easily.  Psychiatric/Behavioral: Negative for dysphoric mood. The patient is not nervous/anxious.        Objective:   Physical Exam        Assessment & Plan:  Vitals reviewed. Constitutional: She is oriented to person, place, and time. She appears well-developed and well-nourished. No distress.       obese  HENT:  Head: Normocephalic and atraumatic.  Right Ear: External ear normal.  Left Ear: External ear normal.  Mouth/Throat: Oropharynx is clear and moist. No oropharyngeal exudate.  Eyes: Conjunctivae and EOM are normal. Pupils are equal, round, and reactive to light. Right eye exhibits no discharge. Left eye exhibits no discharge. No scleral icterus.  Neck: Normal range of motion. Neck supple. No JVD present. No tracheal deviation present. No thyromegaly present.  Cardiovascular: Normal rate, regular rhythm, normal heart sounds and intact distal pulses.  Exam reveals no gallop and no friction rub.   No murmur heard. Pulmonary/Chest: Effort normal and breath sounds normal. No respiratory distress. She has no wheezes. She has no rales. She exhibits no tenderness.  Abdominal: Soft. Bowel sounds are normal. She exhibits no distension and no mass. There is no tenderness. There is no rebound and no guarding.  Musculoskeletal: Normal range of motion. She exhibits no edema and no tenderness.  Lymphadenopathy:    She has no cervical adenopathy.  Neurological: She is alert and oriented to person, place, and time. She has normal reflexes. No cranial nerve deficit. She exhibits normal muscle tone. Coordination normal.  Skin:  Skin is warm and dry. No rash noted. She is not diaphoretic. No erythema. No pallor.  Psychiatric: She has a normal mood and affect. Her behavior is normal. Judgment and thought content normal.

## 2010-09-22 NOTE — Patient Instructions (Addendum)
#  Cough is from multiple reasons - bp pill lisinopril (now stopped), sinus, acid reflux,  asthma and possibly cyclical cough - Glad you are 90% better with addressing acid reflux and bp pill issue. To get you over the remaining 10% - continue to follow all of Dr Kenna Gilbert advice for hiatal hernia and acid reflux issues - you have new diagnosis of  asthma: so start qvar 2 puff twice daily with spacer device -depending on response to above, we might have to start you cyclical cough protocol  #Shortness of breath  - refer to pulmonary rehabilitation for this which is due to obesity, deconditioning and asthma  - hopefully the inhaler QVAR helps you  #Followup  - 6- 8 weeks or sooner if needed - follow with Tammy my nurse for this followup above so she can do a medicine calendar for you as well

## 2010-09-24 ENCOUNTER — Encounter: Payer: Self-pay | Admitting: Internal Medicine

## 2010-09-24 NOTE — Assessment & Plan Note (Signed)
#  Cough is from multiple reasons - bp pill lisinopril (now stopped), sinus, acid reflux,  asthma and possibly cyclical cough - Glad you are 90% better with addressing acid reflux and bp pill issue. To get you over the remaining 10% - continue to follow all of Dr Kenna Gilbert advice for hiatal hernia and acid reflux issues - you have new diagnosis of  asthma: so start qvar 2 puff twice daily with spacer device -depending on response to above, we might have to start you cyclical cough protocol

## 2010-09-24 NOTE — Assessment & Plan Note (Signed)
#  Shortness of breath  - refer to pulmonary rehabilitation for this which is due to obesity, deconditioning and asthma  - hopefully the inhaler QVAR helps you

## 2010-09-29 ENCOUNTER — Ambulatory Visit (INDEPENDENT_AMBULATORY_CARE_PROVIDER_SITE_OTHER): Payer: Medicare Other | Admitting: Adult Health

## 2010-09-29 DIAGNOSIS — J45909 Unspecified asthma, uncomplicated: Secondary | ICD-10-CM | POA: Insufficient documentation

## 2010-09-29 DIAGNOSIS — R05 Cough: Secondary | ICD-10-CM

## 2010-09-29 MED ORDER — BECLOMETHASONE DIPROPIONATE 80 MCG/ACT IN AERS: 2.0000 | INHALATION_SPRAY | Freq: Two times a day (BID) | RESPIRATORY_TRACT | Status: DC

## 2010-09-29 NOTE — Assessment & Plan Note (Addendum)
Improved control on QVAR  And off ACE  Patient's medications were reviewed today and patient education was given. Computerized medication calendar was adjusted/completed  Plan:  Continue QVAR 2 puffs Twice daily  -brush/rinse after use follow up Dr. Marchelle Gearing  In 1 months and As needed

## 2010-09-29 NOTE — Patient Instructions (Addendum)
Follow med calendar closely and bring to each visit.  Continue QVAR 2 puffs Twice daily  -brush/rinse after use follow up Dr. Marchelle Gearing  In 1 months and As needed

## 2010-09-29 NOTE — Progress Notes (Signed)
  Subjective:    Patient ID: Connie Obrien, female    DOB: 06-29-1919, 77 y.o.   MRN: [id number]  HPI 77 yo female seen for initial consult for cough and dyspnea 08/26/10 with Dr. Marchelle Gearing .  ACE related cough w/ d/c of ACE and MCT + for Asthma started on QVAR   OV 09/22/2010: Followup cough (lsinopril, gerd/hiatal hernia), and  dyspnea. Since last visit 08/26/2010 - has given > 4 weeks time for lisinopril to wash out . Also, stopped fish oil and saw Dr Loreta Ave on 09/21/2010: notes revied. She has contniued PPI but has been asked to stop VIt C and GAS X. With above, dry cough now 90% better. Unclear what aggravating or relieving factors for the remainder 10% of cough but denies sinus drainage. But methachioline challenge test 09/16/2010 (> 4 week ace inhibitor wash out) is POSITIVE for ASthma (PD20 is 4). Dyspnea on exertion relieved by rest persists unchanged at class 2-3 levels. There are no other new associated features in interim.   09/29/2010 follow up and med review.  Pt returns for follow up and med review.   We reviewed all her meds and organized them into a med calendar with pt education . It appears she is keeping up with her meds .   Since last ov she is feeling better with decreased cough . " I am feeling much better ". Cough is almost gone.  Decreased dyspnea and wheezing. Taking QVAR without known difficulty.    Review of Systems Constitutional:   No  weight loss, night sweats,  Fevers, chills, fatigue, or  lassitude.  HEENT:   No headaches,  Difficulty swallowing,  Tooth/dental problems, or  Sore throat,                No sneezing, itching, ear ache, nasal congestion, post nasal drip,   CV:  No chest pain,  Orthopnea, PND, swelling in lower extremities, anasarca, dizziness, palpitations, syncope.   GI  No heartburn, indigestion, abdominal pain, nausea, vomiting, diarrhea, change in bowel habits, loss of appetite, bloody stools.   Resp:  No excess mucus, no productive cough,  No  non-productive cough,  No coughing up of blood.  No change in color of mucus.   .  No chest wall deformity  Skin: no rash or lesions.  GU: no dysuria, change in color of urine, no urgency or frequency.  No flank pain, no hematuria   MS:  No joint pain or swelling.  No decreased range of motion.   Psych:  No change in mood or affect. No depression or anxiety.            Objective:   Physical Exam GEN: A/Ox3; pleasant , NAD, elderly   HEENT:  [place]/AT,  EACs-clear, TMs-wnl, NOSE-clear, THROAT-clear, no lesions, no postnasal drip or exudate noted.   NECK:  Supple w/ fair ROM; no JVD; normal carotid impulses w/o bruits; no thyromegaly or nodules palpated; no lymphadenopathy.  RESP  Clear  P & A; w/o, wheezes/ rales/ or rhonchi.no accessory muscle use, no dullness to percussion  CARD:  RRR, no m/r/g  , no peripheral edema, pulses intact, no cyanosis or clubbing.  GI:   Soft & nt; nml bowel sounds; no organomegaly or masses detected.  Musco: Warm bil, no deformities or joint swelling noted.   Neuro: alert, no focal deficits noted.    Skin: Warm, no lesions or rashes         Assessment & Plan:

## 2010-10-02 ENCOUNTER — Other Ambulatory Visit: Payer: Self-pay | Admitting: *Deleted

## 2010-10-02 MED ORDER — CLOBETASOL PROP CREA-COAL TAR 0.05 & 2.3 % EX KIT: PACK | CUTANEOUS | Status: DC

## 2010-10-02 MED ORDER — UNABLE TO FIND: Status: DC

## 2010-10-02 MED ORDER — PROMISEB EX CREA: TOPICAL_CREAM | CUTANEOUS | Status: DC

## 2010-10-02 MED ORDER — TRIAMCINOLONE ACETONIDE 0.1 % EX CREA: TOPICAL_CREAM | Freq: Two times a day (BID) | CUTANEOUS | Status: AC

## 2010-10-02 MED ORDER — FLUTICASONE PROPIONATE 50 MCG/ACT NA SUSP: 2.0000 | Freq: Every day | NASAL | Status: DC | PRN

## 2010-10-02 MED ORDER — PRAVASTATIN SODIUM 40 MG PO TABS: 40.0000 mg | ORAL_TABLET | Freq: Every day | ORAL | Status: DC

## 2010-10-02 MED ORDER — POLYETHYL GLYCOL-PROPYL GLYCOL 0.4-0.3 % OP SOLN: 1.0000 [drp] | Freq: Two times a day (BID) | OPHTHALMIC | Status: DC

## 2010-10-02 MED ORDER — TIMOLOL HEMIHYDRATE 0.5 % OP SOLN: 1.0000 [drp] | Freq: Two times a day (BID) | OPHTHALMIC | Status: DC

## 2010-10-02 MED ORDER — PRO-FLORA PLUS PO CAPS: 1.0000 | ORAL_CAPSULE | Freq: Every day | ORAL | Status: DC

## 2010-10-02 NOTE — Progress Notes (Signed)
7.10.12 med calendar update.

## 2010-10-08 ENCOUNTER — Encounter: Payer: Self-pay | Admitting: Internal Medicine

## 2010-10-22 ENCOUNTER — Encounter (HOSPITAL_COMMUNITY): Payer: Medicare Other | Attending: Internal Medicine

## 2010-10-22 DIAGNOSIS — J45909 Unspecified asthma, uncomplicated: Secondary | ICD-10-CM | POA: Insufficient documentation

## 2010-10-22 DIAGNOSIS — R0989 Other specified symptoms and signs involving the circulatory and respiratory systems: Secondary | ICD-10-CM | POA: Insufficient documentation

## 2010-10-22 DIAGNOSIS — Z5189 Encounter for other specified aftercare: Secondary | ICD-10-CM | POA: Insufficient documentation

## 2010-10-22 DIAGNOSIS — R0609 Other forms of dyspnea: Secondary | ICD-10-CM | POA: Insufficient documentation

## 2010-10-22 DIAGNOSIS — R05 Cough: Secondary | ICD-10-CM | POA: Insufficient documentation

## 2010-10-22 DIAGNOSIS — R059 Cough, unspecified: Secondary | ICD-10-CM | POA: Insufficient documentation

## 2010-10-22 DIAGNOSIS — E669 Obesity, unspecified: Secondary | ICD-10-CM | POA: Insufficient documentation

## 2010-10-27 ENCOUNTER — Encounter (HOSPITAL_COMMUNITY): Payer: Medicare Other

## 2010-10-28 ENCOUNTER — Encounter: Payer: Self-pay | Admitting: Internal Medicine

## 2010-10-28 ENCOUNTER — Ambulatory Visit (INDEPENDENT_AMBULATORY_CARE_PROVIDER_SITE_OTHER): Payer: Medicare Other | Admitting: Internal Medicine

## 2010-10-28 VITALS — BP 126/78 | HR 91 | Temp 97.4°F | Ht 60.0 in | Wt 197.6 lb

## 2010-10-28 DIAGNOSIS — R05 Cough: Secondary | ICD-10-CM

## 2010-10-28 DIAGNOSIS — R0989 Other specified symptoms and signs involving the circulatory and respiratory systems: Secondary | ICD-10-CM

## 2010-10-28 DIAGNOSIS — R0600 Dyspnea, unspecified: Secondary | ICD-10-CM

## 2010-10-28 DIAGNOSIS — R0609 Other forms of dyspnea: Secondary | ICD-10-CM

## 2010-10-28 NOTE — Assessment & Plan Note (Signed)
Folloowup cough (lisinopril., gerd/hiatal hernia and asthma on methacholine challenge June 2012) . Almost resolved with qvar. Advised to call QVAR

## 2010-10-28 NOTE — Assessment & Plan Note (Signed)
Dyspnea (asthma, obesity, deconditioning).   Plan Continue qvar Continue rehab tthat was started yesterday rov 6 months

## 2010-10-28 NOTE — Patient Instructions (Signed)
#  Cough is from multiple reasons - bp pill lisinopril (now stopped), sinus, acid reflux,  asthma and possibly cyclical cough - Glad you are almost 100% better  with addressing acid reflux, bp pill issue and starting QVAR asthma inhaler - continue to follow all of Dr Kenna Gilbert advice for hiatal hernia and acid reflux issues - for athma: continue qvar 2 puff twice daily with spacer device  #Shortness of breath  - glad you are better - please continue pulmonary rehabilitation for this which is due to obesity, deconditioning and asthma   #Followup  - 6-  Months or sooner if needed

## 2010-10-28 NOTE — Progress Notes (Signed)
  Subjective:    Patient ID: Connie Obrien, female    DOB: April 04, 1919, 77 y.o.   MRN: [id number]  HPI  Folloowup cough (lisinopril., gerd/hiatal hernia and asthma on methacholine challenge June 2012) and dyspnea (asthma, obesity, deconditioning)  OV 10/28/2010: Since visit 1 month ago saw NP for med calendar. Using QVAR 2 puff bid. Says with this cough 100% gone almost. Compliant with inhaler. Notices occ mild dry cough when exposed to newspaper, crowds, heat or fire but not as ssevere as before. DYspnea is improved too but still notices when climbs uphill, relieved by rest. Started rehab yesterday; effect not known yet. Following associated gerd advice through Dr. Loreta Ave  Past, Fam. Social reviewed: no changes from 09/22/2010  Review of Systems  Constitutional: Negative for fever and unexpected weight change.  HENT: Negative for ear pain, nosebleeds, congestion, sore throat, rhinorrhea, sneezing, trouble swallowing, dental problem, postnasal drip and sinus pressure.   Eyes: Negative for redness and itching.  Respiratory: Negative for cough, chest tightness, shortness of breath and wheezing.   Cardiovascular: Negative for palpitations and leg swelling.  Gastrointestinal: Negative for nausea and vomiting.  Genitourinary: Negative for dysuria.  Musculoskeletal: Negative for joint swelling.  Skin: Negative for rash.  Neurological: Negative for headaches.  Hematological: Does not bruise/bleed easily.  Psychiatric/Behavioral: Negative for dysphoric mood. The patient is not nervous/anxious.        Objective:   Physical Exam Vitals reviewed. Constitutional: She is oriented to person, place, and time. She appears well-developed and well-nourished. No distress.       obese  HENT:  Head: Normocephalic and atraumatic.  Right Ear: External ear normal.  Left Ear: External ear normal.  Mouth/Throat: Oropharynx is clear and moist. No oropharyngeal exudate.  Eyes: Conjunctivae and EOM are normal.  Pupils are equal, round, and reactive to light. Right eye exhibits no discharge. Left eye exhibits no discharge. No scleral icterus.  Neck: Normal range of motion. Neck supple. No JVD present. No tracheal deviation present. No thyromegaly present.  Cardiovascular: Normal rate, regular rhythm, normal heart sounds and intact distal pulses.  Exam reveals no gallop and no friction rub.   No murmur heard. Pulmonary/Chest: Effort normal and breath sounds normal. No respiratory distress. She has no wheezes. She has no rales. She exhibits no tenderness.  Abdominal: Soft. Bowel sounds are normal. She exhibits no distension and no mass. There is no tenderness. There is no rebound and no guarding.  Musculoskeletal: Normal range of motion. She exhibits no edema and no tenderness.  Lymphadenopathy:    She has no cervical adenopathy.  Neurological: She is alert and oriented to person, place, and time. She has normal reflexes. No cranial nerve deficit. She exhibits normal muscle tone. Coordination normal.  Skin: Skin is warm and dry. No rash noted. She is not diaphoretic. No erythema. No pallor.  Psychiatric: She has a normal mood and affect. Her behavior is normal. Judgment and thought content normal.         Assessment & Plan:

## 2010-10-29 ENCOUNTER — Encounter (HOSPITAL_COMMUNITY)
Admission: RE | Admit: 2010-10-29 | Discharge: 2010-10-29 | Payer: Medicare Other | Source: Ambulatory Visit | Attending: Internal Medicine | Admitting: Internal Medicine

## 2010-11-03 ENCOUNTER — Encounter (HOSPITAL_COMMUNITY): Payer: Medicare Other

## 2010-11-05 ENCOUNTER — Encounter (HOSPITAL_COMMUNITY): Payer: Medicare Other

## 2010-11-10 ENCOUNTER — Encounter (HOSPITAL_COMMUNITY): Payer: Medicare Other

## 2010-11-12 ENCOUNTER — Encounter (HOSPITAL_COMMUNITY): Payer: Medicare Other

## 2010-11-17 ENCOUNTER — Encounter (HOSPITAL_COMMUNITY): Payer: Medicare Other

## 2010-11-19 ENCOUNTER — Encounter (HOSPITAL_COMMUNITY): Payer: Medicare Other

## 2010-11-24 ENCOUNTER — Encounter (HOSPITAL_COMMUNITY): Payer: Medicare Other | Attending: Internal Medicine

## 2010-11-24 DIAGNOSIS — E669 Obesity, unspecified: Secondary | ICD-10-CM | POA: Insufficient documentation

## 2010-11-24 DIAGNOSIS — R0609 Other forms of dyspnea: Secondary | ICD-10-CM | POA: Insufficient documentation

## 2010-11-24 DIAGNOSIS — Z5189 Encounter for other specified aftercare: Secondary | ICD-10-CM | POA: Insufficient documentation

## 2010-11-24 DIAGNOSIS — J45909 Unspecified asthma, uncomplicated: Secondary | ICD-10-CM | POA: Insufficient documentation

## 2010-11-24 DIAGNOSIS — R059 Cough, unspecified: Secondary | ICD-10-CM | POA: Insufficient documentation

## 2010-11-24 DIAGNOSIS — R05 Cough: Secondary | ICD-10-CM | POA: Insufficient documentation

## 2010-11-24 DIAGNOSIS — R0989 Other specified symptoms and signs involving the circulatory and respiratory systems: Secondary | ICD-10-CM | POA: Insufficient documentation

## 2010-11-26 ENCOUNTER — Encounter (HOSPITAL_COMMUNITY): Payer: Medicare Other

## 2010-12-01 ENCOUNTER — Encounter (HOSPITAL_COMMUNITY): Payer: Medicare Other

## 2010-12-03 ENCOUNTER — Encounter (HOSPITAL_COMMUNITY): Payer: Medicare Other

## 2010-12-08 ENCOUNTER — Encounter (HOSPITAL_COMMUNITY): Payer: Medicare Other

## 2010-12-10 ENCOUNTER — Encounter (HOSPITAL_COMMUNITY): Payer: Medicare Other

## 2010-12-15 ENCOUNTER — Encounter (HOSPITAL_COMMUNITY): Payer: Medicare Other

## 2010-12-17 ENCOUNTER — Encounter (HOSPITAL_COMMUNITY): Payer: Medicare Other

## 2010-12-22 ENCOUNTER — Encounter (HOSPITAL_COMMUNITY): Payer: Medicare Other | Attending: Internal Medicine

## 2010-12-22 DIAGNOSIS — J45909 Unspecified asthma, uncomplicated: Secondary | ICD-10-CM | POA: Insufficient documentation

## 2010-12-22 DIAGNOSIS — Z5189 Encounter for other specified aftercare: Secondary | ICD-10-CM | POA: Insufficient documentation

## 2010-12-22 DIAGNOSIS — R0609 Other forms of dyspnea: Secondary | ICD-10-CM | POA: Insufficient documentation

## 2010-12-22 DIAGNOSIS — E669 Obesity, unspecified: Secondary | ICD-10-CM | POA: Insufficient documentation

## 2010-12-22 DIAGNOSIS — R05 Cough: Secondary | ICD-10-CM | POA: Insufficient documentation

## 2010-12-22 DIAGNOSIS — R0989 Other specified symptoms and signs involving the circulatory and respiratory systems: Secondary | ICD-10-CM | POA: Insufficient documentation

## 2010-12-22 DIAGNOSIS — R059 Cough, unspecified: Secondary | ICD-10-CM | POA: Insufficient documentation

## 2010-12-24 ENCOUNTER — Encounter (HOSPITAL_COMMUNITY): Payer: Medicare Other

## 2010-12-29 ENCOUNTER — Encounter (HOSPITAL_COMMUNITY): Payer: Medicare Other

## 2010-12-31 ENCOUNTER — Encounter (HOSPITAL_COMMUNITY): Payer: Medicare Other

## 2011-01-05 ENCOUNTER — Encounter (HOSPITAL_COMMUNITY): Payer: Medicare Other

## 2011-01-07 ENCOUNTER — Encounter (HOSPITAL_COMMUNITY): Payer: Medicare Other

## 2011-01-12 ENCOUNTER — Encounter (HOSPITAL_COMMUNITY): Payer: Medicare Other

## 2011-01-14 ENCOUNTER — Encounter (HOSPITAL_COMMUNITY): Payer: Medicare Other

## 2011-01-19 ENCOUNTER — Encounter (HOSPITAL_COMMUNITY): Payer: Medicare Other

## 2011-01-21 ENCOUNTER — Encounter (HOSPITAL_COMMUNITY): Payer: Medicare Other

## 2011-01-26 ENCOUNTER — Encounter (HOSPITAL_COMMUNITY): Payer: Medicare Other

## 2011-01-28 ENCOUNTER — Encounter (HOSPITAL_COMMUNITY): Payer: Medicare Other

## 2011-02-02 ENCOUNTER — Encounter (HOSPITAL_COMMUNITY): Payer: Medicare Other

## 2011-02-04 ENCOUNTER — Encounter (HOSPITAL_COMMUNITY): Payer: Medicare Other

## 2011-02-10 ENCOUNTER — Other Ambulatory Visit: Payer: Self-pay | Admitting: Family Medicine

## 2011-02-10 ENCOUNTER — Ambulatory Visit (HOSPITAL_COMMUNITY)
Admission: RE | Admit: 2011-02-10 | Discharge: 2011-02-10 | Disposition: A | Discharge status: Home / Self Care | Payer: Medicare Other | Source: Ambulatory Visit | Attending: Family Medicine | Admitting: Family Medicine

## 2011-02-10 DIAGNOSIS — M7989 Other specified soft tissue disorders: Secondary | ICD-10-CM | POA: Insufficient documentation

## 2011-02-10 DIAGNOSIS — R52 Pain, unspecified: Secondary | ICD-10-CM

## 2011-02-10 DIAGNOSIS — R609 Edema, unspecified: Secondary | ICD-10-CM

## 2011-02-10 NOTE — Progress Notes (Signed)
*  PRELIMINARY RESULTS* Left lower extremity venous duplex has been completed.  Left leg is negative for acute deep and superficial vein thrombosis.   Vanna Scotland 02/10/2011, 6:19 PM

## 2011-03-30 DIAGNOSIS — J209 Acute bronchitis, unspecified: Secondary | ICD-10-CM | POA: Diagnosis not present

## 2011-03-30 DIAGNOSIS — B9789 Other viral agents as the cause of diseases classified elsewhere: Secondary | ICD-10-CM | POA: Diagnosis not present

## 2011-04-26 ENCOUNTER — Other Ambulatory Visit: Payer: Self-pay | Admitting: Family Medicine

## 2011-04-26 ENCOUNTER — Ambulatory Visit
Admission: RE | Admit: 2011-04-26 | Discharge: 2011-04-26 | Disposition: A | Discharge status: Home / Self Care | Payer: Medicare Other | Source: Ambulatory Visit | Attending: Family Medicine | Admitting: Family Medicine

## 2011-04-26 DIAGNOSIS — R05 Cough: Secondary | ICD-10-CM | POA: Diagnosis not present

## 2011-04-26 DIAGNOSIS — K449 Diaphragmatic hernia without obstruction or gangrene: Secondary | ICD-10-CM | POA: Diagnosis not present

## 2011-04-26 DIAGNOSIS — R0989 Other specified symptoms and signs involving the circulatory and respiratory systems: Secondary | ICD-10-CM | POA: Diagnosis not present

## 2011-04-26 DIAGNOSIS — Z1331 Encounter for screening for depression: Secondary | ICD-10-CM | POA: Diagnosis not present

## 2011-05-05 ENCOUNTER — Encounter: Payer: Self-pay | Admitting: Internal Medicine

## 2011-05-05 ENCOUNTER — Ambulatory Visit: Payer: Medicare Other | Admitting: Critical Care Medicine

## 2011-05-05 ENCOUNTER — Ambulatory Visit (INDEPENDENT_AMBULATORY_CARE_PROVIDER_SITE_OTHER): Payer: Medicare Other | Admitting: Internal Medicine

## 2011-05-05 VITALS — BP 126/86 | HR 89 | Temp 97.7°F | Ht 60.0 in | Wt 198.8 lb

## 2011-05-05 DIAGNOSIS — R05 Cough: Secondary | ICD-10-CM

## 2011-05-05 MED ORDER — BUDESONIDE-FORMOTEROL FUMARATE 80-4.5 MCG/ACT IN AERO: 2.0000 | INHALATION_SPRAY | Freq: Two times a day (BID) | RESPIRATORY_TRACT | Status: DC

## 2011-05-05 MED ORDER — PREDNISONE 10 MG PO TABS: ORAL_TABLET | ORAL | Status: DC

## 2011-05-05 NOTE — Progress Notes (Signed)
Subjective:    Patient ID: Connie Obrien, female    DOB: 1920-02-13, 78 y.o.   MRN: [id number]  HPI Folloowup cough (lisinopril., gerd/hiatal hernia and asthma on methacholine challenge June 2012) and dyspnea (asthma, obesity, deconditioning)  OV 10/28/2010: Since visit 1 month ago saw NP for med calendar. Using QVAR 2 puff bid. Says with this cough 100% gone almost. Compliant with inhaler. Notices occ mild dry cough when exposed to newspaper, crowds, heat or fire but not as ssevere as before. DYspnea is improved too but still notices when climbs uphill, relieved by rest. Started rehab yesterday; effect not known yet. Following associated gerd advice through Dr. Loreta Ave  Past, Fam. Social reviewed: no changes from 09/22/2010  REC #Cough is from multiple reasons - bp pill lisinopril (now stopped), sinus, acid reflux, asthma and possibly cyclical cough  - Glad you are almost 100% better with addressing acid reflux, bp pill issue and starting QVAR asthma inhaler  - continue to follow all of Dr Kenna Gilbert advice for hiatal hernia and acid reflux issues  - for athma: continue qvar 2 puff twice daily with spacer device  #Shortness of breath  - glad you are better  - please continue pulmonary rehabilitation for this which is due to obesity, deconditioning and asthma  #Followup  - 6- Months or sooner if needed  OV 05/05/2011 FU cough. Reports bronchitis on 03/30/11. Rx Zpak and it did not work. Replaced later with 'stronger antibiotic' and initially thought she improved but cough recurred/worsened a few days later. So, visited PMD. CXR done - reportedly clear. Then given ventolin 04/26/11  (says was wheezing real bad) which she says helped but she at her own discretion stopped QVAR because she did not want to take 2 inhalers and was causing some soreness.  At this point still with cough. Currently RSI score is high - is 35 (level 5 - hoarseness, excess mucus, annoying cough, sensation of lump in throat and  heart burn. Level 3 -clearing of throat. Level 4- cough after lying down, Level 3- choking episodes). She says even this high score is improved since Jan 2013. Currently cough is made worse by perfumes, damp weather, cold air, windy, going outside, bending and eating. Also, she notes food comes back after eating and makes her cough but she is able to swallow pills. IN association, feels there is something in throat all the time and there is something stuck there and some mild wheezing.  No wheezing  Past, Family, Social reviewed: no change since last visit other than in HPI  Review of Systems  Constitutional: Negative for fever and unexpected weight change.  HENT: Positive for sore throat. Negative for ear pain, nosebleeds, congestion, rhinorrhea, sneezing, trouble swallowing, dental problem, postnasal drip and sinus pressure.   Eyes: Negative for redness and itching.  Respiratory: Positive for cough and wheezing. Negative for chest tightness and shortness of breath.   Cardiovascular: Negative for palpitations and leg swelling.  Gastrointestinal: Negative for nausea and vomiting.  Genitourinary: Negative for dysuria.  Musculoskeletal: Negative for joint swelling.  Skin: Negative for rash.  Neurological: Negative for headaches.  Hematological: Does not bruise/bleed easily.  Psychiatric/Behavioral: Negative for dysphoric mood. The patient is not nervous/anxious.        Objective:   Physical Exam Vitals reviewed. Constitutional: She is oriented to person, place, and time. She appears well-developed and well-nourished. No distress.       obese , clears throat HENT:  Head: Normocephalic and atraumatic.  Right Ear: External ear normal.  Left Ear: External ear normal.  Mouth/Throat: Oropharynx is clear and moist. No oropharyngeal exudate.  Eyes: Conjunctivae and EOM are normal. Pupils are equal, round, and reactive to light. Right eye exhibits no discharge. Left eye exhibits no discharge. No  scleral icterus.  Neck: Normal range of motion. Neck supple. No JVD present. No tracheal deviation present. No thyromegaly present.  Cardiovascular: Normal rate, regular rhythm, normal heart sounds and intact distal pulses.  Exam reveals no gallop and no friction rub.   No murmur heard. Pulmonary/Chest: Effort normal and breath sounds normal. No respiratory distress. She has no wheezes. She has no rales. She exhibits no tenderness.  Abdominal: Soft. Bowel sounds are normal. She exhibits no distension and no mass. There is no tenderness. There is no rebound and no guarding.  Musculoskeletal: Normal range of motion. She exhibits no edema and no tenderness.  Lymphadenopathy:    She has no cervical adenopathy.  Neurological: She is alert and oriented to person, place, and time. She has normal reflexes. No cranial nerve deficit. She exhibits normal muscle tone. Coordination normal.  Skin: Skin is warm and dry. No rash noted. She is not diaphoretic. No erythema. No pallor.  Psychiatric: She has a normal mood and affect. Her behavior is normal. Judgment and thought content normal.           Assessment & Plan:

## 2011-05-05 NOTE — Assessment & Plan Note (Signed)
I think your cough is from sinus, hiatal hernia acid reflux and asthma  The asthma is acting up Acid reflux and Hiatal hernia are also likely acting up  PLAN Please take prednisone 40mg  once daily x 3 days, then 30mg  once daily x 3 days, then 20mg  once daily x 3 days, then prednisone 10mg  once daily  x 3 days and stop Please stop qvar Please start symbicort 80/4.5 2 puff twice daily - take sample, script and show technique   - this inhaler you take daily twice Please take ventolin as needed Please see Dr Loreta Ave for acid reflux REturn in 3 weeks Cough score at followup and regroup and reassess

## 2011-05-05 NOTE — Patient Instructions (Addendum)
I think your cough is from sinus, hiatal hernia acid reflux and asthma  The asthma is acting up Please take prednisone 40mg  once daily x 3 days, then 30mg  once daily x 3 days, then 20mg  once daily x 3 days, then prednisone 10mg  once daily  x 3 days and stop Please stop qvar Please start symbicort 80/4.5 2 puff twice daily - take sample, script and show technique   - this inhaler you take daily twice Please take ventolin as needed Please see Dr Loreta Ave for acid reflux REturn in 3 weeks Cough score at followup

## 2011-05-13 DIAGNOSIS — I10 Essential (primary) hypertension: Secondary | ICD-10-CM | POA: Diagnosis not present

## 2011-05-13 DIAGNOSIS — R21 Rash and other nonspecific skin eruption: Secondary | ICD-10-CM | POA: Diagnosis not present

## 2011-05-13 DIAGNOSIS — E785 Hyperlipidemia, unspecified: Secondary | ICD-10-CM | POA: Diagnosis not present

## 2011-05-13 DIAGNOSIS — J45909 Unspecified asthma, uncomplicated: Secondary | ICD-10-CM | POA: Diagnosis not present

## 2011-05-13 DIAGNOSIS — Z23 Encounter for immunization: Secondary | ICD-10-CM | POA: Diagnosis not present

## 2011-05-13 DIAGNOSIS — M899 Disorder of bone, unspecified: Secondary | ICD-10-CM | POA: Diagnosis not present

## 2011-05-13 DIAGNOSIS — M949 Disorder of cartilage, unspecified: Secondary | ICD-10-CM | POA: Diagnosis not present

## 2011-05-18 ENCOUNTER — Other Ambulatory Visit: Payer: Self-pay | Admitting: Gastroenterology

## 2011-05-18 DIAGNOSIS — R110 Nausea: Secondary | ICD-10-CM

## 2011-05-18 DIAGNOSIS — K219 Gastro-esophageal reflux disease without esophagitis: Secondary | ICD-10-CM | POA: Diagnosis not present

## 2011-05-18 DIAGNOSIS — R1033 Periumbilical pain: Secondary | ICD-10-CM | POA: Diagnosis not present

## 2011-05-18 DIAGNOSIS — R112 Nausea with vomiting, unspecified: Secondary | ICD-10-CM | POA: Diagnosis not present

## 2011-05-24 ENCOUNTER — Other Ambulatory Visit: Payer: Self-pay | Admitting: Gastroenterology

## 2011-05-24 ENCOUNTER — Ambulatory Visit
Admission: RE | Admit: 2011-05-24 | Discharge: 2011-05-24 | Disposition: A | Discharge status: Home / Self Care | Payer: Medicare Other | Source: Ambulatory Visit | Attending: Gastroenterology | Admitting: Gastroenterology

## 2011-05-24 DIAGNOSIS — R110 Nausea: Secondary | ICD-10-CM

## 2011-05-24 DIAGNOSIS — R1310 Dysphagia, unspecified: Secondary | ICD-10-CM | POA: Diagnosis not present

## 2011-05-24 DIAGNOSIS — K449 Diaphragmatic hernia without obstruction or gangrene: Secondary | ICD-10-CM | POA: Diagnosis not present

## 2011-05-31 ENCOUNTER — Encounter: Payer: Self-pay | Admitting: Internal Medicine

## 2011-05-31 ENCOUNTER — Ambulatory Visit (INDEPENDENT_AMBULATORY_CARE_PROVIDER_SITE_OTHER): Payer: Medicare Other | Admitting: Internal Medicine

## 2011-05-31 VITALS — BP 122/72 | HR 83 | Temp 98.0°F | Ht 60.0 in | Wt 199.0 lb

## 2011-05-31 DIAGNOSIS — R05 Cough: Secondary | ICD-10-CM

## 2011-05-31 DIAGNOSIS — J209 Acute bronchitis, unspecified: Secondary | ICD-10-CM

## 2011-05-31 MED ORDER — FLUTICASONE PROPIONATE 50 MCG/ACT NA SUSP: 2.0000 | Freq: Every day | NASAL | Status: DC

## 2011-05-31 NOTE — Patient Instructions (Signed)
#  current congestion   Currently I do not see a need for antibiotics. If congestion gets worse, call us #CHronic Cough  -  take generic fluticasone inhaler 2 squirts each nostril daily - continue symbicort 2 puff twice daily - follow with DR Loreta Ave and everything in terms of symptoms now depends on how much she can address the esophageal stricture problem #Followup  -  6 weeks (glad you are better)

## 2011-05-31 NOTE — Progress Notes (Signed)
Subjective:    Patient ID: Connie Obrien, female    DOB: 1919-04-12, 78 y.o.   MRN: [id number]  HPI Folloowup cough (lisinopril., gerd/hiatal hernia and asthma on methacholine challenge June 2012) and dyspnea (asthma, obesity, deconditioning)  OV 10/28/2010: Since visit 1 month ago saw NP for med calendar. Using QVAR 2 puff bid. Says with this cough 100% gone almost. Compliant with inhaler. Notices occ mild dry cough when exposed to newspaper, crowds, heat or fire but not as ssevere as before. DYspnea is improved too but still notices when climbs uphill, relieved by rest. Started rehab yesterday; effect not known yet. Following associated gerd advice through Dr. Loreta Ave  Past, Fam. Social reviewed: no changes from 09/22/2010  REC #Cough is from multiple reasons - bp pill lisinopril (now stopped), sinus, acid reflux, asthma and possibly cyclical cough  - Glad you are almost 100% better with addressing acid reflux, bp pill issue and starting QVAR asthma inhaler  - continue to follow all of Dr Kenna Gilbert advice for hiatal hernia and acid reflux issues  - for athma: continue qvar 2 puff twice daily with spacer device  #Shortness of breath  - glad you are better  - please continue pulmonary rehabilitation for this which is due to obesity, deconditioning and asthma  #Followup  - 6- Months or sooner if needed  OV 05/05/2011 FU cough. Reports bronchitis on 03/30/11. Rx Zpak and it did not work. Replaced later with 'stronger antibiotic' and initially thought she improved but cough recurred/worsened a few days later. So, visited PMD. CXR done - reportedly clear. Then given ventolin 04/26/11  (says was wheezing real bad) which she says helped but she at her own discretion stopped QVAR because she did not want to take 2 inhalers and was causing some soreness.  At this point still with cough. Currently RSI score is high - is 35 (level 5 - hoarseness, excess mucus, annoying cough, sensation of lump in throat and  heart burn. Level 3 -clearing of throat. Level 4- cough after lying down, Level 3- choking episodes). She says even this high score is improved since Jan 2013. Currently cough is made worse by perfumes, damp weather, cold air, windy, going outside, bending and eating. Also, she notes food comes back after eating and makes her cough but she is able to swallow pills. IN association, feels there is something in throat all the time and there is something stuck there and some mild wheezing.  No wheezing  Past, Family, Social reviewed: no change since last visit other than in HPI  REC think your cough is from sinus, hiatal hernia acid reflux and asthma  The asthma is acting up  Please take prednisone 40mg  once daily x 3 days, then 30mg  once daily x 3 days, then 20mg  once daily x 3 days, then prednisone 10mg  once daily x 3 days and stop  Please stop qvar  Please start symbicort 80/4.5 2 puff twice daily - take sample, script and show technique  - this inhaler you take daily twice  Please take ventolin as needed  Please see Dr Loreta Ave for acid reflux  REturn in 3 weeks  Cough score at followup   OV 05/31/2011 Followup cough. RSI cough score is 15 and is > 50% improvement (level 3 clearing throat, level 1 hoarseness, xcess mucus, difficulty swallowing, cough after lying down, breathing diifficulty, troublesome cough). In terms of asthma: feels symbicort 2 puff twice daily inhaler working real well for her. Not needing rescue  inhaler at all.  In terms of hiatal hernia and GERD: saw Dr Loreta Ave and on 05/24/11 had barium swallow - diagnosed with distal esophageal stricture and small hernia. Fu with DR Loreta Ave pending.  However, this morning some congestion in chest (upper). She is worried that she might be getting a cold (went to church yesterday). She does have sneezing past week. Currently not on antibiotics. Denies sputum. Or fever. Feels that when she goes to church she can get sick. She still feels that something is  stuck in upper chest  Review of Systems  Constitutional: Negative for fever and unexpected weight change.  HENT: Negative for ear pain, nosebleeds, congestion, sore throat, rhinorrhea, sneezing, trouble swallowing, dental problem, postnasal drip and sinus pressure.   Eyes: Negative for redness and itching.  Respiratory: Negative for cough, chest tightness, shortness of breath and wheezing.   Cardiovascular: Negative for palpitations and leg swelling.  Gastrointestinal: Negative for nausea and vomiting.  Genitourinary: Negative for dysuria.  Musculoskeletal: Negative for joint swelling.  Skin: Negative for rash.  Neurological: Negative for headaches.  Hematological: Does not bruise/bleed easily.  Psychiatric/Behavioral: Negative for dysphoric mood. The patient is not nervous/anxious.        Objective:   Physical Exam Vitals reviewed. Constitutional: She is oriented to person, place, and time. She appears well-developed and well-nourished. No distress.       obese , clears throat HENT:  Head: Normocephalic and atraumatic.  Right Ear: External ear normal.  Left Ear: External ear normal.  Mouth/Throat: Oropharynx is clear and moist. No oropharyngeal exudate.  Eyes: Conjunctivae and EOM are normal. Pupils are equal, round, and reactive to light. Right eye exhibits no discharge. Left eye exhibits no discharge. No scleral icterus.  Neck: Normal range of motion. Neck supple. No JVD present. No tracheal deviation present. No thyromegaly present.  Cardiovascular: Normal rate, regular rhythm, normal heart sounds and intact distal pulses.  Exam reveals no gallop and no friction rub.   No murmur heard. Pulmonary/Chest: Effort normal and breath sounds normal. No respiratory distress. She has no wheezes. She has no rales. She exhibits no tenderness.  Abdominal: Soft. Bowel sounds are normal. She exhibits no distension and no mass. There is no tenderness. There is no rebound and no guarding.    Musculoskeletal: Normal range of motion. She exhibits no edema and no tenderness.  Lymphadenopathy:    She has no cervical adenopathy.  Neurological: She is alert and oriented to person, place, and time. She has normal reflexes. No cranial nerve deficit. She exhibits normal muscle tone. Coordination normal.  Skin: Skin is warm and dry. No rash noted. She is not diaphoretic. No erythema. No pallor.  Psychiatric: She has a normal mood and affect. Her behavior is normal. Judgment and thought content normal.           Assessment & Plan:

## 2011-06-02 DIAGNOSIS — K2970 Gastritis, unspecified, without bleeding: Secondary | ICD-10-CM | POA: Diagnosis not present

## 2011-06-02 DIAGNOSIS — K219 Gastro-esophageal reflux disease without esophagitis: Secondary | ICD-10-CM | POA: Diagnosis not present

## 2011-06-02 DIAGNOSIS — K2990 Gastroduodenitis, unspecified, without bleeding: Secondary | ICD-10-CM | POA: Diagnosis not present

## 2011-06-04 ENCOUNTER — Telehealth: Payer: Self-pay | Admitting: Internal Medicine

## 2011-06-04 NOTE — Telephone Encounter (Signed)
Spoke with pt and notified of rx per MR. She states that her PCP has already and was prescribed an abx, prednisone and tessalon pearles. She states nothing needed from here now. I advised that she should call for appt if not improving.

## 2011-06-04 NOTE — Telephone Encounter (Signed)
She has  mild attack of copd called COPD exacerbation  Please take doxycycline 100mg twice daily after meals x 5 days; avoid sunlight  Please take prednisone 40mg once daily x 3 days, then 20mg once daily x 3 days, then 10mg once daily x 3 days, then 5mg once dailyx 3 days and stop  

## 2011-06-04 NOTE — Telephone Encounter (Signed)
I spoke with pt and she c/o lots of coughing and is getting up yellow phlem, wheezing, some chest tx, SOB has unchanged, feels tired, and has a headache x couple days. Denies any fever, chills, sweats, nausea, vomiting, sore throat, body aches. Pt has not tried taking anything OTC and does not have any more tessalon pearls for the cough. Pt is requesting to have something called in for her. Please advise Dr. Marchelle Gearing, thanks   Allergies  Allergen Reactions  . Septra (Bactrim)   . Sulfa Antibiotics      [facility] [place]

## 2011-06-07 DIAGNOSIS — J209 Acute bronchitis, unspecified: Secondary | ICD-10-CM | POA: Diagnosis not present

## 2011-06-07 DIAGNOSIS — J45909 Unspecified asthma, uncomplicated: Secondary | ICD-10-CM | POA: Diagnosis not present

## 2011-06-11 ENCOUNTER — Telehealth: Payer: Self-pay | Admitting: Internal Medicine

## 2011-06-11 MED ORDER — PREDNISONE 10 MG PO TABS: ORAL_TABLET | ORAL | Status: DC

## 2011-06-11 NOTE — Telephone Encounter (Signed)
Pt called back again- wants rx asap- coughing "non-stop" . Connie Obrien

## 2011-06-11 NOTE — Telephone Encounter (Signed)
Will forward to MR so he is aware 

## 2011-06-11 NOTE — Telephone Encounter (Signed)
Spoke with pt. She is c/o prod cough with white to yellow sputum since last ov 05/31/11- was placed on abx and pred per her PCP on 06/04/11- she did not remember the name of the abx, but states that it did not help at all. She is also c/o wheezing. She has not had any SOB, and last time had fever was 1 wk ago. She is asking for something to be called in. I offered to see about getting her in to see CDY this pm and she refused due to lack of transportation. Will have to forward to doc of the day since MR is scheduled off this pm. VS, please advise, thanks! Allergies  Allergen Reactions  . Septra (Bactrim)   . Sulfa Antibiotics

## 2011-06-11 NOTE — Telephone Encounter (Signed)
Ok thanks 

## 2011-06-11 NOTE — Telephone Encounter (Signed)
She was treated with antibiotics from her PCP.  She has not been on prednisone for several weeks.  She is still having cough, chest tightness/wheeze, and bringing up white sputum.  She denies fever, sinus congestion, or abdominal symptoms.  She has been using her inhalers, but these don't last.  Will call in script for prednisone taper.  Advised that she does not need additional antibiotics at this time.  Advised her to call back next week if her symptoms do not improve with prednisone.

## 2011-06-14 ENCOUNTER — Encounter: Payer: Self-pay | Admitting: Internal Medicine

## 2011-06-14 DIAGNOSIS — J209 Acute bronchitis, unspecified: Secondary | ICD-10-CM | POA: Insufficient documentation

## 2011-06-14 NOTE — Assessment & Plan Note (Signed)
#  CHronic Cough  -  take generic fluticasone inhaler 2 squirts each nostril daily - continue symbicort 2 puff twice daily - follow with DR Loreta Ave and everything in terms of symptoms now depends on how much she can address the esophageal stricture problem #Followup  -  6 weeks (glad you are better)

## 2011-06-14 NOTE — Assessment & Plan Note (Signed)
#  current congestion   Currently I do not see a need for antibiotics. If congestion gets worse, call us

## 2011-07-06 DIAGNOSIS — M899 Disorder of bone, unspecified: Secondary | ICD-10-CM | POA: Diagnosis not present

## 2011-07-06 DIAGNOSIS — M949 Disorder of cartilage, unspecified: Secondary | ICD-10-CM | POA: Diagnosis not present

## 2011-07-15 ENCOUNTER — Encounter: Payer: Self-pay | Admitting: Internal Medicine

## 2011-07-15 ENCOUNTER — Ambulatory Visit (INDEPENDENT_AMBULATORY_CARE_PROVIDER_SITE_OTHER): Payer: Medicare Other | Admitting: Internal Medicine

## 2011-07-15 VITALS — BP 110/78 | HR 87 | Temp 97.9°F | Ht 61.0 in | Wt 192.6 lb

## 2011-07-15 DIAGNOSIS — R05 Cough: Secondary | ICD-10-CM

## 2011-07-15 NOTE — Patient Instructions (Addendum)
#  CHronic Cough  -  Continue generic fluticasone inhaler 2 squirts each nostril daily - continue symbicort 2 puff twice daily - follow  DR Mann  Advise on acid reflux issues  -important you follow advise 100%   - will refer you to Dr C Young our allergist for allergy issues related to cough; nurse will do order  #Followup  -  12 weeks (glad you are better) or sooner if needed  

## 2011-07-15 NOTE — Progress Notes (Signed)
Subjective:    Patient ID: Connie Obrien, female    DOB: Aug 06, 1919, 78 y.o.   MRN: [id number]  HPI Folloowup cough (lisinopril., gerd/hiatal hernia and asthma on methacholine challenge June 2012) and dyspnea (asthma, obesity, deconditioning)  OV 10/28/2010: Since visit 1 month ago saw NP for med calendar. Using QVAR 2 puff bid. Says with this cough 100% gone almost. Compliant with inhaler. Notices occ mild dry cough when exposed to newspaper, crowds, heat or fire but not as ssevere as before. DYspnea is improved too but still notices when climbs uphill, relieved by rest. Started rehab yesterday; effect not known yet. Following associated gerd advice through Dr. Loreta Ave  Past, Fam. Social reviewed: no changes from 09/22/2010  REC #Cough is from multiple reasons - bp pill lisinopril (now stopped), sinus, acid reflux, asthma and possibly cyclical cough  - Glad you are almost 100% better with addressing acid reflux, bp pill issue and starting QVAR asthma inhaler  - continue to follow all of Dr Kenna Gilbert advice for hiatal hernia and acid reflux issues  - for athma: continue qvar 2 puff twice daily with spacer device  #Shortness of breath  - glad you are better  - please continue pulmonary rehabilitation for this which is due to obesity, deconditioning and asthma  #Followup  - 6- Months or sooner if needed  OV 05/05/2011 FU cough. Reports bronchitis on 03/30/11. Rx Zpak and it did not work. Replaced later with 'stronger antibiotic' and initially thought she improved but cough recurred/worsened a few days later. So, visited PMD. CXR done - reportedly clear. Then given ventolin 04/26/11  (says was wheezing real bad) which she says helped but she at her own discretion stopped QVAR because she did not want to take 2 inhalers and was causing some soreness.  At this point still with cough. Currently RSI score is high - is 35 (level 5 - hoarseness, excess mucus, annoying cough, sensation of lump in throat and  heart burn. Level 3 -clearing of throat. Level 4- cough after lying down, Level 3- choking episodes). She says even this high score is improved since Jan 2013. Currently cough is made worse by perfumes, damp weather, cold air, windy, going outside, bending and eating. Also, she notes food comes back after eating and makes her cough but she is able to swallow pills. IN association, feels there is something in throat all the time and there is something stuck there and some mild wheezing.  No wheezing  Past, Family, Social reviewed: no change since last visit other than in HPI  REC think your cough is from sinus, hiatal hernia acid reflux and asthma  The asthma is acting up  Please take prednisone 40mg  once daily x 3 days, then 30mg  once daily x 3 days, then 20mg  once daily x 3 days, then prednisone 10mg  once daily x 3 days and stop  Please stop qvar  Please start symbicort 80/4.5 2 puff twice daily - take sample, script and show technique  - this inhaler you take daily twice  Please take ventolin as needed  Please see Dr Loreta Ave for acid reflux  REturn in 3 weeks  Cough score at followup   OV 05/31/2011 Followup cough. RSI cough score is 15 and is > 50% improvement (level 3 clearing throat, level 1 hoarseness, xcess mucus, difficulty swallowing, cough after lying down, breathing diifficulty, troublesome cough). In terms of asthma: feels symbicort 2 puff twice daily inhaler working real well for her. Not needing rescue  inhaler at all.  In terms of hiatal hernia and GERD: saw Dr Loreta Ave and on 05/24/11 had barium swallow - diagnosed with distal esophageal stricture and small hernia. Fu with DR Loreta Ave pending.  However, this morning some congestion in chest (upper). She is worried that she might be getting a cold (went to church yesterday). She does have sneezing past week. Currently not on antibiotics. Denies sputum. Or fever. Feels that when she goes to church she can get sick. She still feels that something is  stuck in upper chest   #current congestion  Currently I do not see a need for antibiotics. If congestion gets worse, call us  #CHronic Cough  - take generic fluticasone inhaler 2 squirts each nostril daily  - continue symbicort 2 puff twice daily  - follow with DR Loreta Ave and everything in terms of symptoms now depends on how much she can address the esophageal stricture problem  #Followup  - 6 weeks (glad you are better)  OV 07/15/2011  Cough improved. RSI score down to 9; Feels best ever. All level 1 mild symptoms in the 9 point scale. Feels allergy season is keeping her sinuses draining and causing cough. Using nasal steroid prn only. But using symbicort daily. Also saw Dr Loreta Ave and per hx sounds like she has loose sphincter and asked to eat slowly; this has helped cough and gerd symptoms. Complaints of itching; wants to see allergist for overall allergy eval esp with asthma, sinus issues as well  Past, Family, Social reviewed: no change since last visit   Review of Systems  Constitutional: Negative for fever and unexpected weight change.  HENT: Positive for voice change. Negative for ear pain, nosebleeds, congestion, sore throat, rhinorrhea, sneezing, trouble swallowing, dental problem, postnasal drip and sinus pressure.   Eyes: Negative for redness and itching.  Respiratory: Negative for cough, chest tightness, shortness of breath and wheezing.   Cardiovascular: Negative for palpitations and leg swelling.  Gastrointestinal: Negative for nausea and vomiting.  Genitourinary: Negative for dysuria.  Musculoskeletal: Negative for joint swelling.  Skin: Negative for rash.  Neurological: Negative for headaches.  Hematological: Does not bruise/bleed easily.  Psychiatric/Behavioral: Negative for dysphoric mood. The patient is not nervous/anxious.        Objective:   Physical Exam Vitals reviewed. Constitutional: She is oriented to person, place, and time. She appears well-developed and  well-nourished. No distress.       obese , clears throat HENT:  Head: Normocephalic and atraumatic.  Right Ear: External ear normal.  Left Ear: External ear normal.  Mouth/Throat: Oropharynx is clear and moist. No oropharyngeal exudate.  Eyes: Conjunctivae and EOM are normal. Pupils are equal, round, and reactive to light. Right eye exhibits no discharge. Left eye exhibits no discharge. No scleral icterus.  Neck: Normal range of motion. Neck supple. No JVD present. No tracheal deviation present. No thyromegaly present.  Cardiovascular: Normal rate, regular rhythm, normal heart sounds and intact distal pulses.  Exam reveals no gallop and no friction rub.   No murmur heard. Pulmonary/Chest: Effort normal and breath sounds normal. No respiratory distress. She has no wheezes. She has no rales. She exhibits no tenderness.  Abdominal: Soft. Bowel sounds are normal. She exhibits no distension and no mass. There is no tenderness. There is no rebound and no guarding.  Musculoskeletal: Normal range of motion. She exhibits no edema and no tenderness.  Lymphadenopathy:    She has no cervical adenopathy.  Neurological: She is alert and oriented to person,  place, and time. She has normal reflexes. No cranial nerve deficit. She exhibits normal muscle tone. Coordination normal.  Skin: Skin is warm and dry. No rash noted. She is not diaphoretic. No erythema. No pallor.  Psychiatric: She has a normal mood and affect. Her behavior is normal. Judgment and thought content normal.           Assessment & Plan:

## 2011-07-17 ENCOUNTER — Encounter: Payer: Self-pay | Admitting: Internal Medicine

## 2011-07-17 NOTE — Assessment & Plan Note (Signed)
#  CHronic Cough  -  Continue generic fluticasone inhaler 2 squirts each nostril daily - continue symbicort 2 puff twice daily - follow  DR Loreta Ave  Advise on acid reflux issues  -important you follow advise 100%   - will refer you to Dr Melba Coon our allergist for allergy issues related to cough; nurse will do order  #Followup  -  12 weeks (glad you are better) or sooner if needed

## 2011-07-20 DIAGNOSIS — H4010X0 Unspecified open-angle glaucoma, stage unspecified: Secondary | ICD-10-CM | POA: Diagnosis not present

## 2011-07-20 DIAGNOSIS — H409 Unspecified glaucoma: Secondary | ICD-10-CM | POA: Diagnosis not present

## 2011-08-06 DIAGNOSIS — H4010X0 Unspecified open-angle glaucoma, stage unspecified: Secondary | ICD-10-CM | POA: Diagnosis not present

## 2011-08-23 DIAGNOSIS — M549 Dorsalgia, unspecified: Secondary | ICD-10-CM | POA: Diagnosis not present

## 2011-08-23 DIAGNOSIS — K5900 Constipation, unspecified: Secondary | ICD-10-CM | POA: Diagnosis not present

## 2011-08-27 ENCOUNTER — Other Ambulatory Visit (INDEPENDENT_AMBULATORY_CARE_PROVIDER_SITE_OTHER): Payer: Medicare Other

## 2011-08-27 ENCOUNTER — Ambulatory Visit (INDEPENDENT_AMBULATORY_CARE_PROVIDER_SITE_OTHER): Payer: Medicare Other | Admitting: Internal Medicine

## 2011-08-27 ENCOUNTER — Other Ambulatory Visit: Payer: Self-pay | Admitting: Internal Medicine

## 2011-08-27 ENCOUNTER — Encounter: Payer: Self-pay | Admitting: Internal Medicine

## 2011-08-27 VITALS — BP 138/76 | HR 82 | Ht 60.25 in | Wt 193.2 lb

## 2011-08-27 DIAGNOSIS — L299 Pruritus, unspecified: Secondary | ICD-10-CM | POA: Diagnosis not present

## 2011-08-27 LAB — CBC WITH DIFFERENTIAL/PLATELET
Eosinophils Relative: 2.2 % (ref 0.0–5.0)
HCT: 40.1 % (ref 36.0–46.0)
Hemoglobin: 13.1 g/dL (ref 12.0–15.0)
Lymphs Abs: 2.4 10*3/uL (ref 0.7–4.0)
Monocytes Relative: 10 % (ref 3.0–12.0)
Platelets: 202 10*3/uL (ref 150.0–400.0)
RBC: 4.16 Mil/uL (ref 3.87–5.11)
WBC: 6.5 10*3/uL (ref 4.5–10.5)

## 2011-08-27 NOTE — Progress Notes (Signed)
08/27/11- 89 yoF never smoker, referred by Dr Marchelle Gearing for allergy evaluation. She complains of itching without visible rash. She is followed by Dr. Marchelle Gearing for asthma/bronchitis. Many years ago she was on allergy vaccine from Dr. Alleen Borne, indicating that she was atopic with positive skin tests at that time. She now describes itching and burning which may go back as far as 2005. It is mostly involved her scalp, ears and nose. A dermatologist gave a cream which does not help. She has been using Elidel 1% cream at bedtime. She tried Zyrtec without benefit although it helps rhinitis and sneezing. She has no history of sensitivity to foods, cosmetics or aspirin. No prior history of hives. No history of liver disease. She thinks exposure to animals makes it worse and nothing really benefits. Symptoms are persistent with some variation from day to day. She took prednisone at the end of March but is not sure that made any difference. Her children have hayfever but otherwise there is no relevant family history. She is living alone, retired without unusual exposures.  Prior to Admission medications   Medication Sig Start Date End Date Taking? Authorizing Provider  aspirin 81 MG tablet Take 81 mg by mouth daily.     Yes Historical Provider, MD  bimatoprost (LUMIGAN) 0.03 % ophthalmic solution Place 1 drop into both eyes at bedtime.     Yes Historical Provider, MD  budesonide-formoterol (SYMBICORT) 80-4.5 MCG/ACT inhaler Inhale 2 puffs into the lungs 2 (two) times daily. 05/05/11 05/04/12 Yes Kalman Shan, MD  calcium citrate-vitamin D (CITRACAL+D) 315-200 MG-UNIT per tablet Take 1 tablet by mouth 2 (two) times daily.     Yes Historical Provider, MD  cetirizine (ZYRTEC) 10 MG tablet Take 10 mg by mouth daily.     Yes Historical Provider, MD  Clobetasol Prop Crea-Coal Tar 0.05 & 2.3 % KIT Apply to scalp as directed 10/02/10  Yes Tammy S Parrett, NP  DEXILANT 60 MG capsule Take 1 tablet by mouth Daily. 05/24/11   Yes Historical Provider, MD  ELIDEL 1 % cream apply twice daily 07/06/10  Yes Historical Provider, MD  estradiol (ESTRACE) 0.1 MG/GM vaginal cream Place 2 g vaginally 2 (two) times a week.     Yes Historical Provider, MD  fluticasone (FLONASE) 50 MCG/ACT nasal spray Place 2 sprays into the nose daily. 05/31/11 05/30/12 Yes Kalman Shan, MD  furosemide (LASIX) 40 MG tablet Take 1 tablet by mouth Daily. 04/21/11  Yes Historical Provider, MD  KLOR-CON M20 20 MEQ tablet Take 1 tablet by mouth Daily. 03/12/11  Yes Historical Provider, MD  losartan (COZAAR) 100 MG tablet  08/07/11  Yes Historical Provider, MD  Nutritional Supplements (PRO-FLORA PLUS) CAPS Take 1 capsule by mouth daily. 10/02/10  Yes Tammy S Parrett, NP  Polyethyl Glycol-Propyl Glycol (SYSTANE) 0.4-0.3 % SOLN Place 1 drop into both eyes 2 (two) times daily. 10/02/10  Yes Tammy S Parrett, NP  pravastatin (PRAVACHOL) 40 MG tablet Take 1 tablet (40 mg total) by mouth at bedtime. 10/02/10  Yes Julio Sicks, NP  Spacer/Aero-Holding Chambers (AEROCHAMBER PLUS) inhaler Use as instructed with Qvar inhaler twice daily 09/22/10 09/22/11 Yes Kalman Shan, MD  timolol (BETIMOL) 0.5 % ophthalmic solution Place 1 drop into both eyes 2 (two) times daily. 10/02/10  Yes Tammy S Parrett, NP  timolol (TIMOPTIC) 0.5 % ophthalmic solution  07/22/11  Yes Historical Provider, MD  VENTOLIN HFA 108 (90 BASE) MCG/ACT inhaler Inhale 1 puff into the lungs Every 6 hours as needed. 04/26/11  Yes Historical Provider, MD  vitamin D, CHOLECALCIFEROL, 400 UNITS tablet Take 400 Units by mouth daily.     Yes Historical Provider, MD  vitamin E 400 UNIT capsule Take 400 Units by mouth daily.     Yes Historical Provider, MD  benzonatate (TESSALON) 100 MG capsule Take 100 mg by mouth 3 (three) times daily as needed.      Historical Provider, MD  tiZANidine (ZANAFLEX) 2 MG tablet  08/23/11   Historical Provider, MD  triamcinolone (KENALOG) 0.1 % cream Apply topically 2 (two) times  daily. 10/02/10 10/02/11  Julio Sicks, NP   Past Medical History  Diagnosis Date  . Obesity   . Osteoarthritis   . Scoliosis   . Glaucoma   . Deaf   . Allergic rhinitis   . Diverticulosis   . Asthma   . HTN (hypertension)   . Osteopenia   . GERD (gastroesophageal reflux disease)   . IBS (irritable bowel syndrome)    Past Surgical History  Procedure Date  . Total abdominal hysterectomy   . Abdominal hernia repair   . Appendectomy   . Colon resection   . Cataract extraction   . Hemorrhoid surgery   . Breast biopsy   . Nissen fundoplication    Family History  Problem Relation Age of Onset  . Coronary artery disease Father   . Diabetes Father   . Coronary artery disease Mother   . Coronary artery disease Brother   . Prostate cancer Brother     x2   History   Social History  . Marital Status: Widowed    Spouse Name: N/A    Number of Children: 8  . Years of Education: N/A   Occupational History  . retired from [employer]    Social History Main Topics  . Smoking status: Never Smoker   . Smokeless tobacco: Not on file  . Alcohol Use: No  . Drug Use: No  . Sexually Active: Not on file   Other Topics Concern  . Not on file   Social History Narrative  . No narrative on file   ROS-see HPI Constitutional:   No-   weight loss, night sweats, fevers, chills, fatigue, lassitude. HEENT:   No-  headaches, difficulty swallowing, tooth/dental problems, sore throat,       No-  sneezing, itching, ear ache, nasal congestion, post nasal drip,  CV:  No-   chest pain, orthopnea, PND, swelling in lower extremities, anasarca,                                  dizziness, palpitations Resp: No-   shortness of breath with exertion or at rest.              No-   productive cough,  No non-productive cough,  No- coughing up of blood.              No-   change in color of mucus.  No- wheezing.   Skin: No-   rash or lesions. GI:  No-   heartburn, indigestion, abdominal pain,  nausea, vomiting, diarrhea,                 change in bowel habits, loss of appetite GU: No-   dysuria, change in color of urine, no urgency or frequency.  No- flank pain. MS:  No-   joint pain or swelling.  No- decreased range of  motion.  No- back pain. Neuro-     nothing unusual Psych:  No- change in mood or affect. No depression or anxiety.  No memory loss.  OBJ- Physical Exam General- Alert, Oriented, Affect-appropriate, Distress- none acute Skin- faint paint areas on cheeks and scalp above the hairline. No scaling or excoriation. Lymphadenopathy- none Head- atraumatic            Eyes- Gross vision intact, PERRLA, conjunctivae and secretions clear            Ears- Hearing, canals-normal            Nose- Clear, no-Septal dev, mucus, polyps, erosion, perforation             Throat- Mallampati III , mucosa clear , drainage- none, tonsils- atrophic, worn teeth with dental repair Neck- flexible , trachea midline, no stridor , thyroid nl, carotid no bruit Chest - symmetrical excursion , unlabored           Heart/CV- RRR , no murmur , no gallop  , no rub, nl s1 s2                           - JVD- none , edema- none, stasis changes- none, varices- none           Lung- clear to P&A, wheeze- none, cough- none , dullness-none, rub- none           Chest wall-  Abd- tender-no, distended-no, bowel sounds-present, HSM- no Br/ Gen/ Rectal- Not done, not indicated Extrem- cyanosis- none, clubbing, none, atrophy- none, strength- nl Neuro- grossly intact to observation

## 2011-08-27 NOTE — Progress Notes (Deleted)
Subjective:    Patient ID: Connie Obrien, female    DOB: 08/01/1919, 78 y.o.   MRN: [id number]  HPI Folloowup cough (lisinopril., gerd/hiatal hernia and asthma on methacholine challenge June 2012) and dyspnea (asthma, obesity, deconditioning)  OV 10/28/2010: Since visit 1 month ago saw NP for med calendar. Using QVAR 2 puff bid. Says with this cough 100% gone almost. Compliant with inhaler. Notices occ mild dry cough when exposed to newspaper, crowds, heat or fire but not as ssevere as before. DYspnea is improved too but still notices when climbs uphill, relieved by rest. Started rehab yesterday; effect not known yet. Following associated gerd advice through Dr. Mann  Past, Fam. Social reviewed: no changes from 09/22/2010  REC #Cough is from multiple reasons - bp pill lisinopril (now stopped), sinus, acid reflux, asthma and possibly cyclical cough  - Glad you are almost 100% better with addressing acid reflux, bp pill issue and starting QVAR asthma inhaler  - continue to follow all of Dr Mann's advice for hiatal hernia and acid reflux issues  - for athma: continue qvar 80mcg 2 puff twice daily with spacer device  #Shortness of breath  - glad you are better  - please continue pulmonary rehabilitation for this which is due to obesity, deconditioning and asthma  #Followup  - 6- Months or sooner if needed  OV 05/05/2011 FU cough. Reports bronchitis on 03/30/11. Rx Zpak and it did not work. Replaced later with 'stronger antibiotic' and initially thought she improved but cough recurred/worsened a few days later. So, visited PMD. CXR done - reportedly clear. Then given ventolin 04/26/11  (says was wheezing real bad) which she says helped but she at her own discretion stopped QVAR because she did not want to take 2 inhalers and was causing some soreness.  At this point still with cough. Currently RSI score is high - is 35 (level 5 - hoarseness, excess mucus, annoying cough, sensation of lump in throat and  heart burn. Level 3 -clearing of throat. Level 4- cough after lying down, Level 3- choking episodes). She says even this high score is improved since Jan 2013. Currently cough is made worse by perfumes, damp weather, cold air, windy, going outside, bending and eating. Also, she notes food comes back after eating and makes her cough but she is able to swallow pills. IN association, feels there is something in throat all the time and there is something stuck there and some mild wheezing.  No wheezing  Past, Family, Social reviewed: no change since last visit other than in HPI  REC think your cough is from sinus, hiatal hernia acid reflux and asthma  The asthma is acting up  Please take prednisone 40mg once daily x 3 days, then 30mg once daily x 3 days, then 20mg once daily x 3 days, then prednisone 10mg once daily x 3 days and stop  Please stop qvar  Please start symbicort 80/4.5 2 puff twice daily - take sample, script and show technique  - this inhaler you take daily twice  Please take ventolin as needed  Please see Dr Mann for acid reflux  REturn in 3 weeks  Cough score at followup   OV 05/31/2011 Followup cough. RSI cough score is 15 and is > 50% improvement (level 3 clearing throat, level 1 hoarseness, xcess mucus, difficulty swallowing, cough after lying down, breathing diifficulty, troublesome cough). In terms of asthma: feels symbicort 2 puff twice daily inhaler working real well for her. Not needing rescue   inhaler at all.  In terms of hiatal hernia and GERD: saw Dr Mann and on 05/24/11 had barium swallow - diagnosed with distal esophageal stricture and small hernia. Fu with DR Mann pending.  However, this morning some congestion in chest (upper). She is worried that she might be getting a cold (went to church yesterday). She does have sneezing past week. Currently not on antibiotics. Denies sputum. Or fever. Feels that when she goes to church she can get sick. She still feels that something is  stuck in upper chest   #current congestion  Currently I do not see a need for antibiotics. If congestion gets worse, call us  #CHronic Cough  - take generic fluticasone inhaler 2 squirts each nostril daily  - continue symbicort 2 puff twice daily  - follow with DR Mann and everything in terms of symptoms now depends on how much she can address the esophageal stricture problem  #Followup  - 6 weeks (glad you are better)  OV 07/15/2011  Cough improved. RSI score down to 9; Feels best ever. All level 1 mild symptoms in the 9 point scale. Feels allergy season is keeping her sinuses draining and causing cough. Using nasal steroid prn only. But using symbicort daily. Also saw Dr Mann and per hx sounds like she has loose sphincter and asked to eat slowly; this has helped cough and gerd symptoms. Complaints of itching; wants to see allergist for overall allergy eval esp with asthma, sinus issues as well  Past, Family, Social reviewed: no change since last visit   Review of Systems  Constitutional: Negative for fever and unexpected weight change.  HENT: Positive for voice change. Negative for ear pain, nosebleeds, congestion, sore throat, rhinorrhea, sneezing, trouble swallowing, dental problem, postnasal drip and sinus pressure.   Eyes: Negative for redness and itching.  Respiratory: Negative for cough, chest tightness, shortness of breath and wheezing.   Cardiovascular: Negative for palpitations and leg swelling.  Gastrointestinal: Negative for nausea and vomiting.  Genitourinary: Negative for dysuria.  Musculoskeletal: Negative for joint swelling.  Skin: Negative for rash.  Neurological: Negative for headaches.  Hematological: Does not bruise/bleed easily.  Psychiatric/Behavioral: Negative for dysphoric mood. The patient is not nervous/anxious.        Objective:   Physical Exam Vitals reviewed. Constitutional: She is oriented to person, place, and time. She appears well-developed and  well-nourished. No distress.       obese , clears throat HENT:  Head: Normocephalic and atraumatic.  Right Ear: External ear normal.  Left Ear: External ear normal.  Mouth/Throat: Oropharynx is clear and moist. No oropharyngeal exudate.  Eyes: Conjunctivae and EOM are normal. Pupils are equal, round, and reactive to light. Right eye exhibits no discharge. Left eye exhibits no discharge. No scleral icterus.  Neck: Normal range of motion. Neck supple. No JVD present. No tracheal deviation present. No thyromegaly present.  Cardiovascular: Normal rate, regular rhythm, normal heart sounds and intact distal pulses.  Exam reveals no gallop and no friction rub.   No murmur heard. Pulmonary/Chest: Effort normal and breath sounds normal. No respiratory distress. She has no wheezes. She has no rales. She exhibits no tenderness.  Abdominal: Soft. Bowel sounds are normal. She exhibits no distension and no mass. There is no tenderness. There is no rebound and no guarding.  Musculoskeletal: Normal range of motion. She exhibits no edema and no tenderness.  Lymphadenopathy:    She has no cervical adenopathy.  Neurological: She is alert and oriented to person,   place, and time. She has normal reflexes. No cranial nerve deficit. She exhibits normal muscle tone. Coordination normal.  Skin: Skin is warm and dry. No rash noted. She is not diaphoretic. No erythema. No pallor.  Psychiatric: She has a normal mood and affect. Her behavior is normal. Judgment and thought content normal.           Assessment & Plan:   

## 2011-08-27 NOTE — Patient Instructions (Addendum)
Order-  CBC w/ diff, Allergy profile and food allergy profile  Dx pruritus  Instead of Protonix, for one month only-   Try taking otc Pepcid/ famotidine 20 mg, twice daily before meals  Continue Zyrtec or try changing it for a month to Allegra 180/ fexofenadine

## 2011-08-30 LAB — ALLERGY FULL AND FOOD SPECIFIC PROFILE
Allergen, D pternoyssinus,d7: <0.1 kU/L (ref <0.35)
Alternaria Alternata: <0.1 kU/L (ref <0.35)
Apple: <0.1 kU/L (ref <0.35)
Aspergillus fumigatus, IgG: <0.1 kU/L (ref <0.35)
Bahia Grass: <0.1 kU/L (ref <0.35)
Bermuda Grass: <0.1 kU/L (ref <0.35)
Cat Dander: <0.1 kU/L (ref <0.35)
Chicken IgE: <0.1 kU/L (ref <0.35)
Common Ragweed: <0.1 kU/L (ref <0.35)
Elm IgE: <0.1 kU/L (ref <0.35)
Fish Cod: <0.1 kU/L (ref <0.35)
G009 Red Top: <0.1 kU/L (ref <0.35)
House Dust Hollister: <0.1 kU/L (ref <0.35)
Lamb's Quarters: <0.1 kU/L (ref <0.35)
Milk IgE: <0.1 kU/L (ref <0.35)
Plantain: <0.1 kU/L (ref <0.35)
Soybean IgE: <0.1 kU/L (ref <0.35)
Sycamore Tree: <0.1 kU/L (ref <0.35)
Tuna IgE: <0.1 kU/L (ref <0.35)

## 2011-09-02 DIAGNOSIS — L299 Pruritus, unspecified: Secondary | ICD-10-CM | POA: Insufficient documentation

## 2011-09-02 NOTE — Assessment & Plan Note (Addendum)
There may be very slight pinkness to the skin she associates with itching. I can't tell but she has been rubbing or scratching at it. Dry skin, rosacea and seborrhea may be in the differential diagnosis. She has seen a dermatologist which is appropriate, and is using Elidel cream and Cetaphil which are reasonable. I am asked for assessment of allergy status. We can address this by ordering IgE profiles. I will try augmenting her antihistamine management including H1 and H2.

## 2011-09-09 NOTE — Progress Notes (Signed)
Quick Note:  LMTCB ______ 

## 2011-09-10 ENCOUNTER — Telehealth: Payer: Self-pay | Admitting: Internal Medicine

## 2011-09-10 NOTE — Progress Notes (Signed)
Quick Note:  Spoke with pt. Advised of allergy profile results per Dr. Maple Hudson. She verbalized understanding of this and voiced no further questions/concerns at this time. ______

## 2011-09-10 NOTE — Telephone Encounter (Signed)
Notes Recorded by Waymon Budge, MD on 08/31/2011 at 12:24 PM All,ergy profile is negative for elevated antibody levels, making important allergy unlikely.  ---  Called, spoke with pt.  Advised of above results per CDY.  She verbalized understanding and voiced no further questions/concerns at this time.

## 2011-10-01 DIAGNOSIS — Z961 Presence of intraocular lens: Secondary | ICD-10-CM | POA: Diagnosis not present

## 2011-10-12 ENCOUNTER — Ambulatory Visit (INDEPENDENT_AMBULATORY_CARE_PROVIDER_SITE_OTHER): Payer: Medicare Other | Admitting: Internal Medicine

## 2011-10-12 ENCOUNTER — Encounter: Payer: Self-pay | Admitting: Internal Medicine

## 2011-10-12 VITALS — BP 138/72 | HR 83 | Ht 60.0 in | Wt 195.0 lb

## 2011-10-12 DIAGNOSIS — L299 Pruritus, unspecified: Secondary | ICD-10-CM

## 2011-10-12 NOTE — Patient Instructions (Addendum)
Try using Head and Shoulders shampoo as a body wash as well as a shampoo, for 2-3 weeks. If this itching is due to a skin fungus, the medicine in the shampoo should help.

## 2011-10-12 NOTE — Progress Notes (Signed)
08/27/11- 89 yoF never smoker, referred by Dr Marchelle Gearing for allergy evaluation. She complains of itching without visible rash. She is followed by Dr. Marchelle Gearing for asthma/bronchitis. Many years ago she was on allergy vaccine from Dr. Alleen Borne, indicating that she was atopic with positive skin tests at that time. She now describes itching and burning which may go back as far as 2005. It is mostly involved her scalp, ears and nose. A dermatologist gave a cream which does not help. She has been using Elidel 1% cream at bedtime. She tried Zyrtec without benefit although it helps rhinitis and sneezing. She has no history of sensitivity to foods, cosmetics or aspirin. No prior history of hives. No history of liver disease. She thinks exposure to animals makes it worse and nothing really benefits. Symptoms are persistent with some variation from day to day. She took prednisone at the end of March but is not sure that made any difference. Her children have hayfever but otherwise there is no relevant family history. She is living alone, retired without unusual exposures.  10/12/11- 89 yoF never smoker referred by Dr Marchelle Gearing for allergy evaluation. She complains of itching without visible rash. Pt here to discuss allergy labs  Daughter here Allergy profile-08/27/2011-negative with total IgE 6.7 No change in her generalized itching. Daily Zyrtec did not help. Elidel cream did not help. She last saw Dr. Lupton/dermatology 6 months ago. Today she demonstrates some very slightly pink nonraised areas on her arms where she itches.  ROS-see HPI Constitutional:   No-   weight loss, night sweats, fevers, chills, fatigue, lassitude. HEENT:   No-  headaches, difficulty swallowing, tooth/dental problems, sore throat,       No-  sneezing, itching, ear ache, nasal congestion, post nasal drip,  CV:  No-   chest pain, orthopnea, PND, swelling in lower extremities, anasarca,  dizziness, palpitations Resp: No-   shortness of  breath with exertion or at rest.              No-   productive cough,  No non-productive cough,  No- coughing up of blood.              No-   change in color of mucus.  No- wheezing.   Skin: No-   rash or lesions. GI:  No-   heartburn, indigestion, abdominal pain, nausea, vomiting,  GU:  MS:  No-   joint pain or swelling.   Neuro-     nothing unusual Psych:  No- change in mood or affect. No depression or anxiety.  No memory loss.  OBJ- Physical Exam General- Alert, Oriented, Affect-appropriate, Distress- none acute Skin- faint pink areas on  Upper arms, cheeks and scalp above the hairline. No scaling or excoriation. Possibly from rubbing. Lymphadenopathy- none Head- atraumatic            Eyes- Gross vision intact, PERRLA, conjunctivae and secretions clear            Ears- Hearing, canals-normal            Nose- Clear, no-Septal dev, mucus, polyps, erosion, perforation             Throat- Mallampati III , mucosa clear , drainage- none, tonsils- atrophic, worn teeth with dental repair Neck- flexible , trachea midline, no stridor , thyroid nl, carotid no bruit Chest - symmetrical excursion , unlabored           Heart/CV- RRR , no murmur , no gallop  , no rub, nl  s1 s2                           - JVD- none , edema- none, stasis changes- none, varices- none           Lung- clear to P&A, wheeze- none, cough- none , dullness-none, rub- none           Chest wall-  Abd-  Br/ Gen/ Rectal- Not done, not indicated Extrem- cyanosis- none, clubbing, none, atrophy- none, strength- nl Neuro- grossly intact to observation

## 2011-10-16 NOTE — Assessment & Plan Note (Signed)
Itching is her primary complaint but there is no evidence that it is related to an atopic allergy. Suspect neurodermatitis and dry skin. One other possibility is at low grade fungal dermatitis/tinea for which she can try using a standard dandruff shampoo as a body wash for a few weeks. If that doesn't help, she could try low dose prednisone. I doubt that I will have more to offer.

## 2011-10-29 ENCOUNTER — Ambulatory Visit (INDEPENDENT_AMBULATORY_CARE_PROVIDER_SITE_OTHER): Payer: Medicare Other | Admitting: Internal Medicine

## 2011-10-29 ENCOUNTER — Encounter: Payer: Self-pay | Admitting: Internal Medicine

## 2011-10-29 VITALS — BP 140/80 | HR 74 | Temp 98.2°F | Ht 60.0 in | Wt 193.4 lb

## 2011-10-29 DIAGNOSIS — R05 Cough: Secondary | ICD-10-CM | POA: Diagnosis not present

## 2011-10-29 NOTE — Progress Notes (Signed)
Subjective:    Patient ID: Connie Obrien, female    DOB: [birth year], 78 y.o.   MRN: [id number]  HPI OV 10/28/2010: Since visit 1 month ago saw NP for med calendar. Using QVAR 2 puff bid. Says with this cough 100% gone almost. Compliant with inhaler. Notices occ mild dry cough when exposed to newspaper, crowds, heat or fire but not as ssevere as before. DYspnea is improved too but still notices when climbs uphill, relieved by rest. Started rehab yesterday; effect not known yet. Following associated gerd advice through Connie. Loreta Obrien  Past, Fam. Social reviewed: no changes from 09/22/2010  REC #Cough is from multiple reasons - bp pill lisinopril (now stopped), sinus, acid reflux, asthma and possibly cyclical cough  - Glad you are almost 100% better with addressing acid reflux, bp pill issue and starting QVAR asthma inhaler  - continue to follow all of Connie Obrien advice for hiatal hernia and acid reflux issues  - for athma: continue qvar 2 puff twice daily with spacer device  #Shortness of breath  - glad you are better  - please continue pulmonary rehabilitation for this which is due to obesity, deconditioning and asthma  #Followup  - 6- Months or sooner if needed  OV 05/05/2011 FU cough. Reports bronchitis on 03/30/11. Rx Zpak and it did not work. Replaced later with 'stronger antibiotic' and initially thought she improved but cough recurred/worsened a few days later. So, visited PMD. CXR done - reportedly clear. Then given ventolin 04/26/11  (says was wheezing real bad) which she says helped but she at her own discretion stopped QVAR because she did not want to take 2 inhalers and was causing some soreness.  At this point still with cough. Currently RSI score is high - is 35 (level 5 - hoarseness, excess mucus, annoying cough, sensation of lump in throat and heart burn. Level 3 -clearing of throat. Level 4- cough after lying down, Level 3- choking episodes). She says even this high score is improved since  Jan 2013. Currently cough is made worse by perfumes, damp weather, cold air, windy, going outside, bending and eating. Also, she notes food comes back after eating and makes her cough but she is able to swallow pills. IN association, feels there is something in throat all the time and there is something stuck there and some mild wheezing.  No wheezing  Past, Family, Social reviewed: no change since last visit other than in HPI  REC think your cough is from sinus, hiatal hernia acid reflux and asthma  The asthma is acting up  Please take prednisone 40mg  once daily x 3 days, then 30mg  once daily x 3 days, then 20mg  once daily x 3 days, then prednisone 10mg  once daily x 3 days and stop  Please stop qvar  Please start symbicort 80/4.5 2 puff twice daily - take sample, script and show technique  - this inhaler you take daily twice  Please take ventolin as needed  Please see Connie Connie Obrien for acid reflux  REturn in 3 weeks  Cough score at followup   OV 05/31/2011 Followup cough. RSI cough score is 15 and is > 50% improvement (level 3 clearing throat, level 1 hoarseness, xcess mucus, difficulty swallowing, cough after lying down, breathing diifficulty, troublesome cough). In terms of asthma: feels symbicort 2 puff twice daily inhaler working real well for her. Not needing rescue inhaler at all.  In terms of hiatal hernia and GERD: saw Connie Connie Obrien and on 05/24/11 had  barium swallow - diagnosed with distal esophageal stricture and small hernia. Fu with Connie Connie Obrien pending.  However, this morning some congestion in chest (upper). She is worried that she might be getting a cold (went to church yesterday). She does have sneezing past week. Currently not on antibiotics. Denies sputum. Or fever. Feels that when she goes to church she can get sick. She still feels that something is stuck in upper chest   #current congestion  Currently I do not see a need for antibiotics. If congestion gets worse, call us  #CHronic Cough  -  take generic fluticasone inhaler 2 squirts each nostril daily  - continue symbicort 2 puff twice daily  - follow with Connie Connie Obrien and everything in terms of symptoms now depends on how much she can address the esophageal stricture problem  #Followup  - 6 weeks (glad you are better)  OV 07/15/2011  Cough improved. RSI score down to 9; Feels best ever. All level 1 mild symptoms in the 9 point scale. Feels allergy season is keeping her sinuses draining and causing cough. Using nasal steroid prn only. But using symbicort daily. Also saw Connie Connie Obrien and per hx sounds like she has loose sphincter and asked to eat slowly; this has helped cough and gerd symptoms. Complaints of itching; wants to see allergist for overall allergy eval esp with asthma, sinus issues as well  Past, Family, Social reviewed: no change since last visit  #CHronic Cough  -  Continue generic fluticasone inhaler 2 squirts each nostril daily - continue symbicort 2 puff twice daily - follow  Connie Connie Obrien  Advise on acid reflux issues  -important you follow advise 100%   - will refer you to Connie Connie Obrien our allergist for allergy issues related to cough; nurse will do order  #Followup  -  12 weeks (glad you are better) or sooner if needed    OV 08/27/11 with Connie Connie Obrien - 62 yoF never smoker, referred by Connie Connie Obrien for allergy evaluation. She complains of itching without visible rash. She is followed by Connie. Marchelle Obrien for asthma/bronchitis. Many years ago she was on allergy vaccine from Connie. Alleen Obrien, indicating that she was atopic with positive skin tests at that time.  She now describes itching and burning which may go back as far as 2005. It is mostly involved her scalp, ears and nose. A dermatologist gave a cream which does not help. She has been using Elidel 1% cream at bedtime. She tried Zyrtec without benefit although it helps rhinitis and sneezing. She has no history of sensitivity to foods, cosmetics or aspirin. No prior history of hives. No  history of liver disease. She thinks exposure to animals makes it worse and nothing really benefits. Symptoms are persistent with some variation from day to day. She took prednisone at the end of March but is not sure that made any difference.  Her children have hayfever but otherwise there is no relevant family history. She is living alone, retired without unusual exposures.  10/12/11- 89 yoF never smoker referred by Connie Connie Obrien for allergy evaluation. She complains of itching without visible rash.  Pt here to discuss allergy labs Daughter here  Allergy profile-08/27/2011-negative with total IgE 6.7  No change in her generalized itching. Daily Zyrtec did not help. Elidel cream did not help. She last saw Connie. Lupton/dermatology 6 months ago. Today she demonstrates some very slightly pink nonraised areas on her arms where she itches.   REC Try using Head and Shoulders  shampoo as a body wash as well as a shampoo, for 2-3 weeks. If this itching is due to a skin fungus, the medicine in the shampoo should help.    OV 10/29/2011 Followup chronic cough. Dong well. Cough at best level ever. RSI cough score is 10.5 and lowest level; similar to before. She is happy with current quality of life. Kouffman differentiator suggests gerd still present. In fact, she complains of spitting mucus after eating. She has no new complaints.   Connie Obrien Reflux Symptom Index (> 13-15 suggestive of LPR cough) 0 -> 5  =  none ->severe problem  Hoarseness of problem with voice 1  Clearing  Of Throat 2  Excess throat mucus or feeling of post nasal drip 2  Difficulty swallowing food, liquid or tablets 1  Cough after eating or lying down 1  Breathing difficulties or choking episodes 0  Troublesome or annoying cough 0  Sensation of something sticking in throat or lump in throat 1  Heartburn, chest pain, indigestion, or stomach acid coming up 2.5  TOTAL 10.5 and similar to before     Kouffman Reflux v Neurogenic Cough  Differentiator Reflux Comments  Do you awaken from a sound sleep coughing violently?                            With trouble breathing?    Do you have choking episodes when you cannot  Get enough air, gasping for air ?                 Do you usually cough when you lie down into  The bed, or when you just lie down to rest ?                          Yes   Do you usually cough after meals or eating?         Yes   Do you cough when (or after) you bend over?    Yes   GERD SCORE  3   Kouffman Reflux v Neurogenic Cough Differentiator Neurogenic   Do you more-or-less cough all day long?    Does change of temperature make you cough? yes   Does laughing or chuckling cause you to cough?    Do fumes (perfume, automobile fumes, burned  Toast, etc.,) cause you to cough ?      yes   Does speaking, singing, or talking on the phone cause you to cough   ?                  Neurogenic/Airway score 2     Past, Family, Social reviewed: no change since last visit   Review of Systems  Constitutional: Negative for fever and unexpected weight change.  HENT: Positive for trouble swallowing. Negative for ear pain, nosebleeds, congestion, sore throat, rhinorrhea, sneezing, dental problem, postnasal drip and sinus pressure.   Eyes: Negative for redness and itching.  Respiratory: Negative for cough, chest tightness, shortness of breath and wheezing.   Cardiovascular: Negative for palpitations and leg swelling.  Gastrointestinal: Negative for nausea and vomiting.  Genitourinary: Negative for dysuria.  Musculoskeletal: Negative for joint swelling.  Skin: Negative for rash.  Neurological: Negative for headaches.  Hematological: Does not bruise/bleed easily.  Psychiatric/Behavioral: Negative for dysphoric mood. The patient is not nervous/anxious.        Objective:   Physical Exam General- Alert,  Oriented, Affect-appropriate, Distress- none acute Skin- faint pink areas on  Upper arms, cheeks and scalp above  the hairline. No scaling or excoriation. Possibly from rubbing. Lymphadenopathy- none Head- atraumatic            Eyes- Gross vision intact, PERRLA, conjunctivae and secretions clear            Ears- Hearing, canals-normal            Nose- Clear, no-Septal dev, mucus, polyps, erosion, perforation             Throat- Mallampati III , mucosa clear , drainage- none, tonsils- atrophic, worn teeth with dental repair Neck- flexible , trachea midline, no stridor , thyroid nl, carotid no bruit Chest - symmetrical excursion , unlabored           Heart/CV- RRR , no murmur , no gallop  , no rub, nl s1 s2                           - JVD- none , edema- none, stasis changes- none, varices- none           Lung- clear to P&A, wheeze- none, cough- none , dullness-none, rub- none           Chest wall-  Abd-  Br/ Gen/ Rectal- Not done, not indicated Extrem- cyanosis- none, clubbing, none, atrophy- none, strength- nl Neuro- grossly intact to observation         Assessment & Plan:

## 2011-10-29 NOTE — Patient Instructions (Addendum)
#  CHronic Cough  -  Continue generic fluticasone inhaler 2 squirts each nostril daily - continue symbicort 2 puff twice daily; nurse will ensure enough refills - follow  DR Loreta Ave  Advise on acid reflux issues  -important you follow advise 100%   - #Followup  - 9 months with cough score at followup

## 2011-11-07 NOTE — Assessment & Plan Note (Signed)
#  CHronic Cough  -  Continue generic fluticasone inhaler 2 squirts each nostril daily - continue symbicort 2 puff twice daily; nurse will ensure enough refills - follow  DR Mann  Advise on acid reflux issues  -important you follow advise 100%   - #Followup  - 9 months with cough score at followup   

## 2011-11-10 DIAGNOSIS — E785 Hyperlipidemia, unspecified: Secondary | ICD-10-CM | POA: Diagnosis not present

## 2011-11-10 DIAGNOSIS — N390 Urinary tract infection, site not specified: Secondary | ICD-10-CM | POA: Diagnosis not present

## 2011-11-10 DIAGNOSIS — R7301 Impaired fasting glucose: Secondary | ICD-10-CM | POA: Diagnosis not present

## 2011-11-10 DIAGNOSIS — R300 Dysuria: Secondary | ICD-10-CM | POA: Diagnosis not present

## 2011-11-10 DIAGNOSIS — I10 Essential (primary) hypertension: Secondary | ICD-10-CM | POA: Diagnosis not present

## 2011-11-10 DIAGNOSIS — N899 Noninflammatory disorder of vagina, unspecified: Secondary | ICD-10-CM | POA: Diagnosis not present

## 2011-11-25 DIAGNOSIS — I10 Essential (primary) hypertension: Secondary | ICD-10-CM | POA: Diagnosis not present

## 2011-11-25 DIAGNOSIS — R002 Palpitations: Secondary | ICD-10-CM | POA: Diagnosis not present

## 2011-11-25 DIAGNOSIS — E785 Hyperlipidemia, unspecified: Secondary | ICD-10-CM | POA: Diagnosis not present

## 2011-11-25 DIAGNOSIS — I471 Supraventricular tachycardia: Secondary | ICD-10-CM | POA: Diagnosis not present

## 2011-12-22 DIAGNOSIS — Z23 Encounter for immunization: Secondary | ICD-10-CM | POA: Diagnosis not present

## 2011-12-31 DIAGNOSIS — N390 Urinary tract infection, site not specified: Secondary | ICD-10-CM | POA: Diagnosis not present

## 2012-01-17 DIAGNOSIS — M549 Dorsalgia, unspecified: Secondary | ICD-10-CM | POA: Diagnosis not present

## 2012-02-08 DIAGNOSIS — B373 Candidiasis of vulva and vagina: Secondary | ICD-10-CM | POA: Diagnosis not present

## 2012-02-08 DIAGNOSIS — R7301 Impaired fasting glucose: Secondary | ICD-10-CM | POA: Diagnosis not present

## 2012-02-08 DIAGNOSIS — I10 Essential (primary) hypertension: Secondary | ICD-10-CM | POA: Diagnosis not present

## 2012-02-08 DIAGNOSIS — N390 Urinary tract infection, site not specified: Secondary | ICD-10-CM | POA: Diagnosis not present

## 2012-02-08 DIAGNOSIS — L940 Localized scleroderma [morphea]: Secondary | ICD-10-CM | POA: Diagnosis not present

## 2012-02-22 ENCOUNTER — Other Ambulatory Visit: Payer: Self-pay | Admitting: Dermatology

## 2012-02-22 DIAGNOSIS — L219 Seborrheic dermatitis, unspecified: Secondary | ICD-10-CM | POA: Diagnosis not present

## 2012-02-22 DIAGNOSIS — C4330 Malignant melanoma of unspecified part of face: Secondary | ICD-10-CM | POA: Diagnosis not present

## 2012-02-22 DIAGNOSIS — L570 Actinic keratosis: Secondary | ICD-10-CM | POA: Diagnosis not present

## 2012-02-22 DIAGNOSIS — L723 Sebaceous cyst: Secondary | ICD-10-CM | POA: Diagnosis not present

## 2012-02-22 DIAGNOSIS — D485 Neoplasm of uncertain behavior of skin: Secondary | ICD-10-CM | POA: Diagnosis not present

## 2012-03-02 DIAGNOSIS — N390 Urinary tract infection, site not specified: Secondary | ICD-10-CM | POA: Diagnosis not present

## 2012-03-06 DIAGNOSIS — C4330 Malignant melanoma of unspecified part of face: Secondary | ICD-10-CM | POA: Diagnosis not present

## 2012-03-21 DIAGNOSIS — H409 Unspecified glaucoma: Secondary | ICD-10-CM | POA: Diagnosis not present

## 2012-03-21 DIAGNOSIS — H4010X0 Unspecified open-angle glaucoma, stage unspecified: Secondary | ICD-10-CM | POA: Diagnosis not present

## 2012-03-21 DIAGNOSIS — Z961 Presence of intraocular lens: Secondary | ICD-10-CM | POA: Diagnosis not present

## 2012-03-21 DIAGNOSIS — H3530 Unspecified macular degeneration: Secondary | ICD-10-CM | POA: Diagnosis not present

## 2012-03-28 DIAGNOSIS — N390 Urinary tract infection, site not specified: Secondary | ICD-10-CM | POA: Diagnosis not present

## 2012-04-11 DIAGNOSIS — H409 Unspecified glaucoma: Secondary | ICD-10-CM | POA: Diagnosis not present

## 2012-04-11 DIAGNOSIS — H4010X0 Unspecified open-angle glaucoma, stage unspecified: Secondary | ICD-10-CM | POA: Diagnosis not present

## 2012-05-10 DIAGNOSIS — Z1331 Encounter for screening for depression: Secondary | ICD-10-CM | POA: Diagnosis not present

## 2012-05-10 DIAGNOSIS — K219 Gastro-esophageal reflux disease without esophagitis: Secondary | ICD-10-CM | POA: Diagnosis not present

## 2012-06-05 ENCOUNTER — Other Ambulatory Visit: Payer: Self-pay | Admitting: *Deleted

## 2012-06-05 MED ORDER — BUDESONIDE-FORMOTEROL FUMARATE 80-4.5 MCG/ACT IN AERO: 2.0000 | INHALATION_SPRAY | Freq: Two times a day (BID) | RESPIRATORY_TRACT | Status: DC

## 2012-06-13 ENCOUNTER — Other Ambulatory Visit: Payer: Self-pay | Admitting: Internal Medicine

## 2012-06-13 MED ORDER — BUDESONIDE-FORMOTEROL FUMARATE 80-4.5 MCG/ACT IN AERO: 2.0000 | INHALATION_SPRAY | Freq: Two times a day (BID) | RESPIRATORY_TRACT | Status: DC

## 2012-07-26 ENCOUNTER — Other Ambulatory Visit: Payer: Self-pay | Admitting: Dermatology

## 2012-07-26 DIAGNOSIS — D485 Neoplasm of uncertain behavior of skin: Secondary | ICD-10-CM | POA: Diagnosis not present

## 2012-07-26 DIAGNOSIS — D239 Other benign neoplasm of skin, unspecified: Secondary | ICD-10-CM | POA: Diagnosis not present

## 2012-07-26 DIAGNOSIS — L570 Actinic keratosis: Secondary | ICD-10-CM | POA: Diagnosis not present

## 2012-07-26 DIAGNOSIS — L821 Other seborrheic keratosis: Secondary | ICD-10-CM | POA: Diagnosis not present

## 2012-07-26 DIAGNOSIS — Z85820 Personal history of malignant melanoma of skin: Secondary | ICD-10-CM | POA: Diagnosis not present

## 2012-07-26 DIAGNOSIS — L820 Inflamed seborrheic keratosis: Secondary | ICD-10-CM | POA: Diagnosis not present

## 2012-07-28 DIAGNOSIS — H409 Unspecified glaucoma: Secondary | ICD-10-CM | POA: Diagnosis not present

## 2012-07-28 DIAGNOSIS — H4010X0 Unspecified open-angle glaucoma, stage unspecified: Secondary | ICD-10-CM | POA: Diagnosis not present

## 2012-08-01 ENCOUNTER — Ambulatory Visit: Payer: Medicare Other | Admitting: Internal Medicine

## 2012-08-01 DIAGNOSIS — H4010X0 Unspecified open-angle glaucoma, stage unspecified: Secondary | ICD-10-CM | POA: Diagnosis not present

## 2012-08-01 DIAGNOSIS — H409 Unspecified glaucoma: Secondary | ICD-10-CM | POA: Diagnosis not present

## 2012-08-03 DIAGNOSIS — I10 Essential (primary) hypertension: Secondary | ICD-10-CM | POA: Diagnosis not present

## 2012-08-03 DIAGNOSIS — L940 Localized scleroderma [morphea]: Secondary | ICD-10-CM | POA: Diagnosis not present

## 2012-08-03 DIAGNOSIS — R7301 Impaired fasting glucose: Secondary | ICD-10-CM | POA: Diagnosis not present

## 2012-08-03 DIAGNOSIS — E785 Hyperlipidemia, unspecified: Secondary | ICD-10-CM | POA: Diagnosis not present

## 2012-08-04 IMAGING — RF DG UGI W/ KUB
19 of 24 series · 19 of 24 positions shown · non-contrast
Comparison: CT abdomen of 07/03/2003

CLINICAL DATA: Dysphagia, regurgitation post prandially

UPPER GI SERIES WITH KUB
TECHNIQUE: Routine upper GI series was performed with thin barium
Fluoroscopy Time: 5.0 minutes

[Series 1: run · 1 of 9 slices shown (1 of 19)]
[im 1/9]
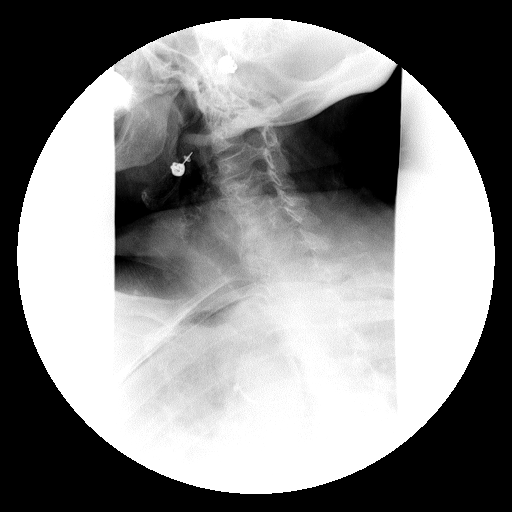

[Series 2: run · 1 of 1 slices shown (2 of 19)]
[im 1/1]
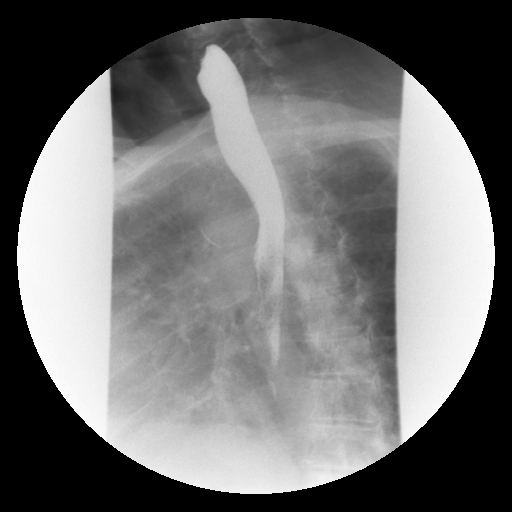

[Series 4: run · 1 of 1 slices shown (3 of 19)]
[im 1/1]
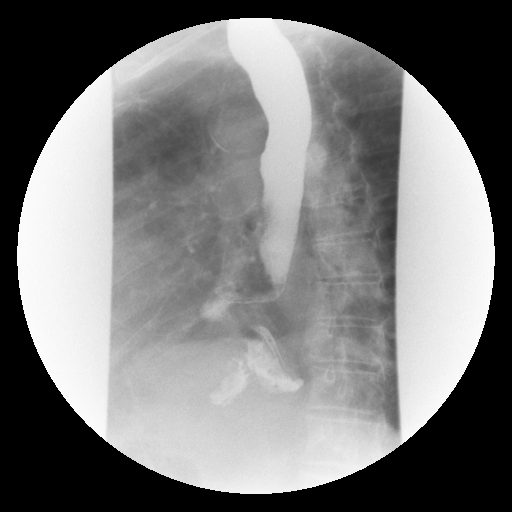

[Series 5: run · 1 of 1 slices shown (4 of 19)]
[im 1/1]
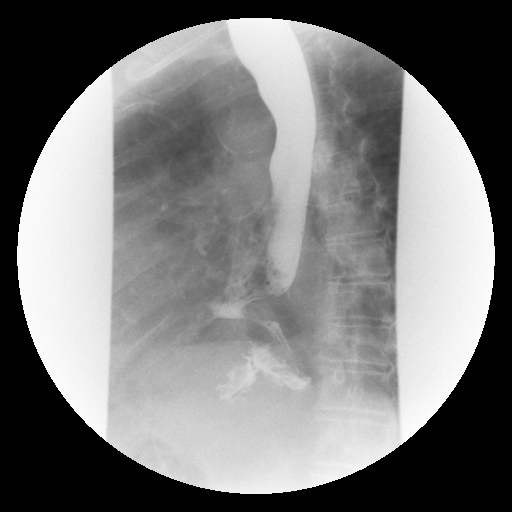

[Series 6: run · 1 of 1 slices shown (5 of 19)]
[im 1/1]
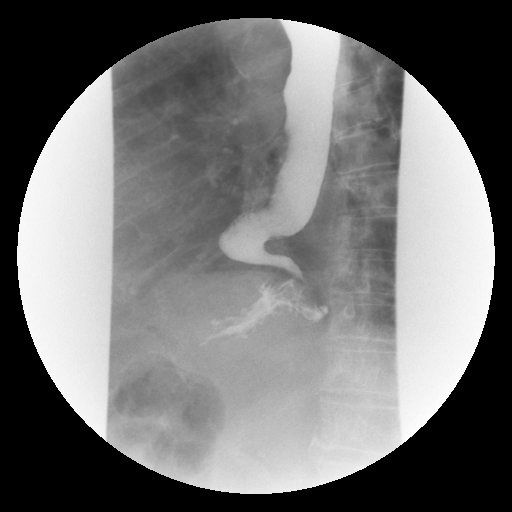

[Series 7: run · 1 of 1 slices shown (6 of 19)]
[im 1/1]
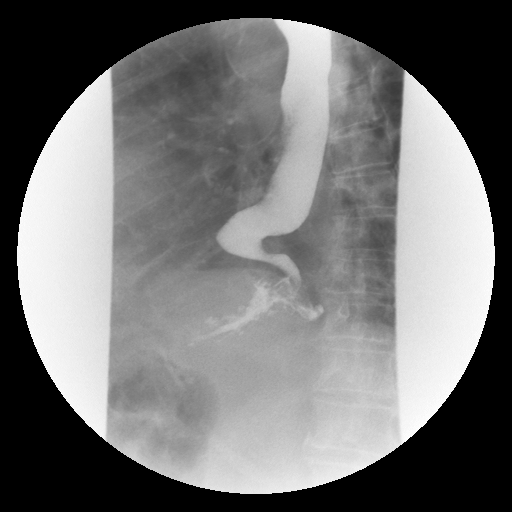

[Series 9: run · 1 of 1 slices shown (7 of 19)]
[im 1/1]
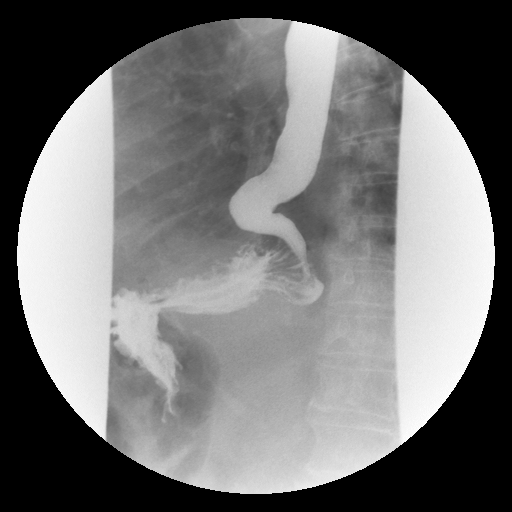

[Series 10: run · 1 of 1 slices shown (8 of 19)]
[im 1/1]
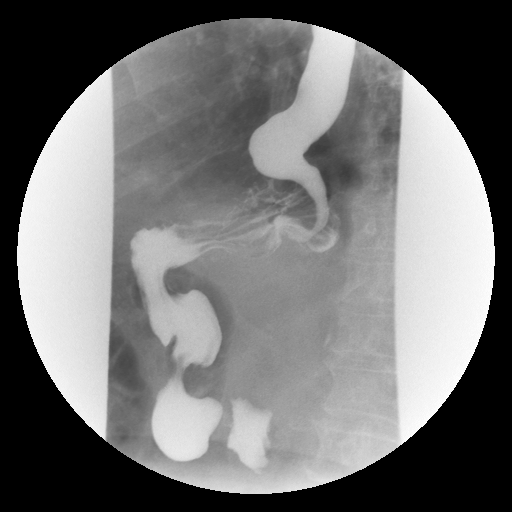

[Series 11: run · 1 of 1 slices shown (9 of 19)]
[im 1/1]
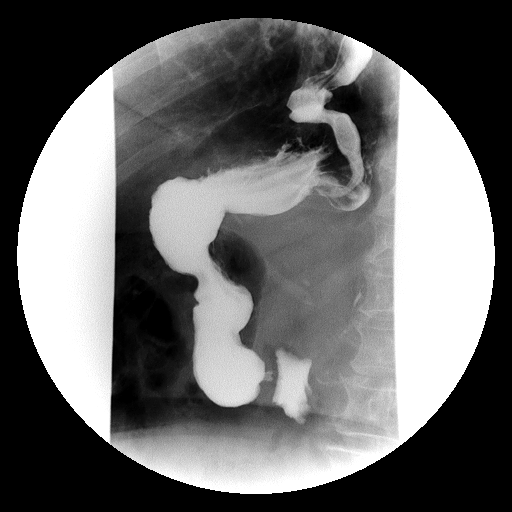

[Series 13: run · 1 of 1 slices shown (10 of 19)]
[im 1/1]
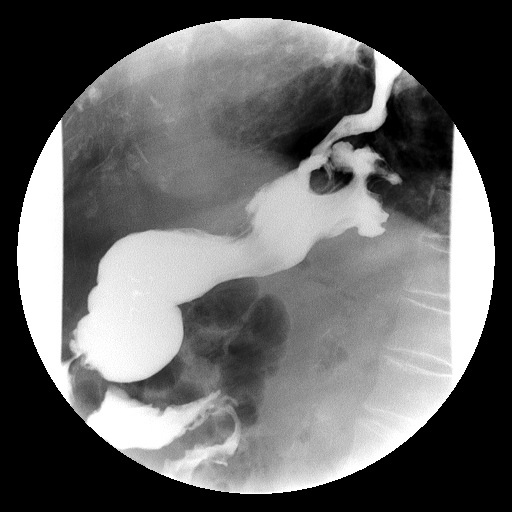

[Series 14: run · 1 of 1 slices shown (11 of 19)]
[im 1/1]
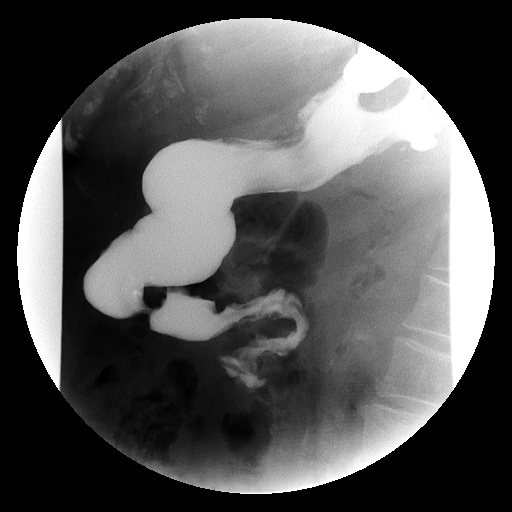

[Series 15: run · 1 of 1 slices shown (12 of 19)]
[im 1/1]
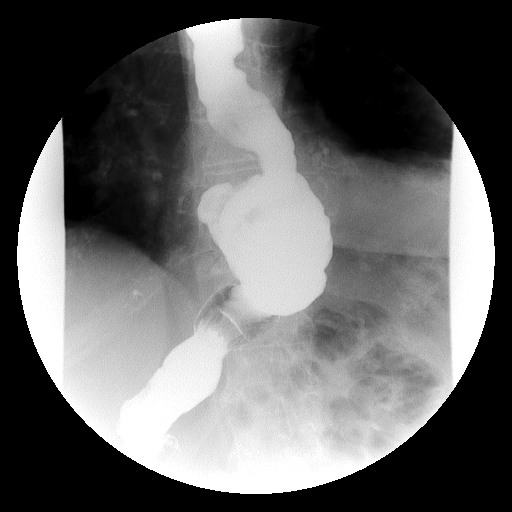

[Series 16: run · 1 of 1 slices shown (13 of 19)]
[im 1/1]
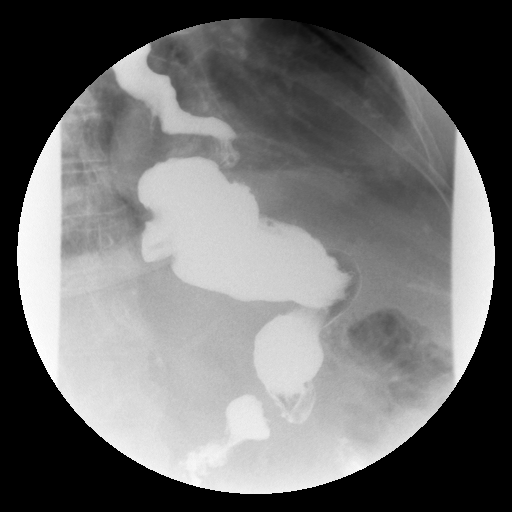

[Series 18: run · 1 of 1 slices shown (14 of 19)]
[im 1/1]
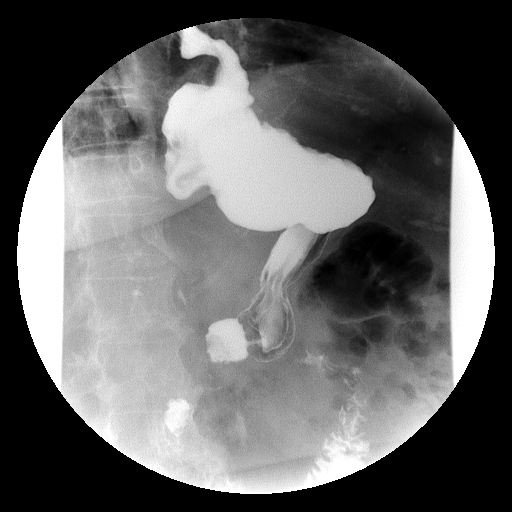

[Series 21: run · 1 of 1 slices shown (15 of 19)]
[im 1/1]
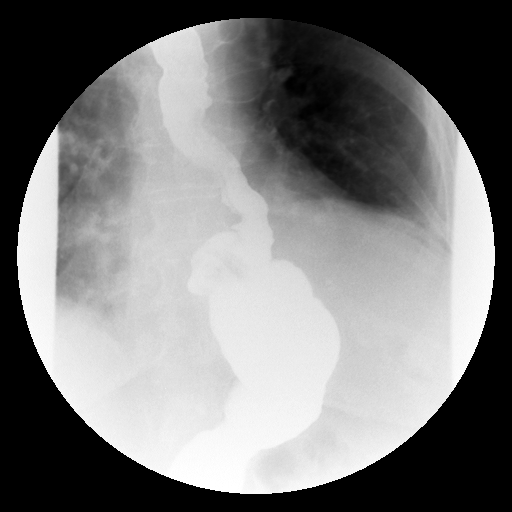

[Series 22: run · 1 of 1 slices shown (16 of 19)]
[im 1/1]
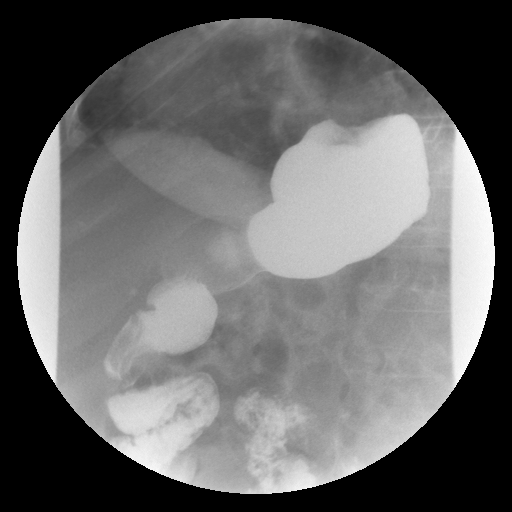

[Series 23: run · 1 of 1 slices shown (17 of 19)]
[im 1/1]
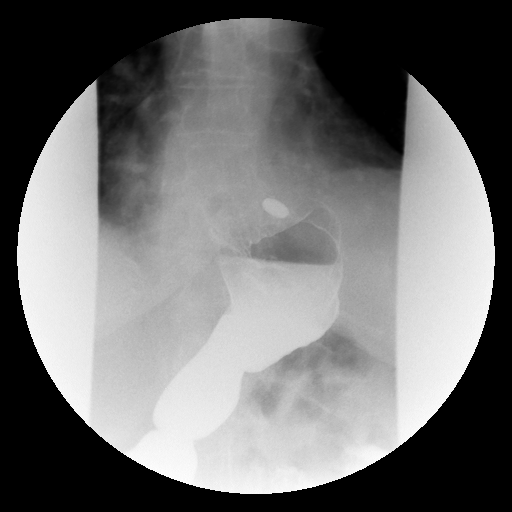

[Series 25: run · 1 of 1 slices shown (18 of 19)]
[im 1/1]
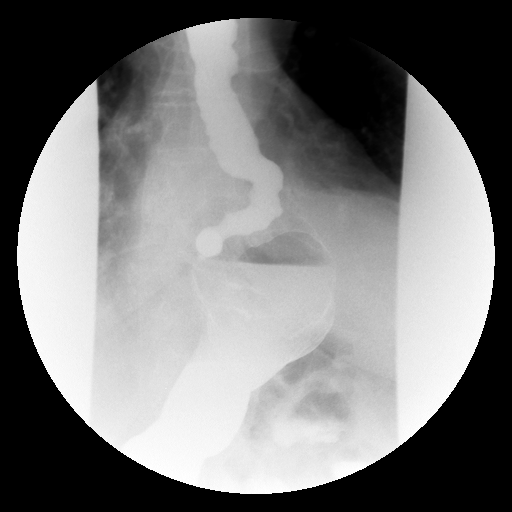

[Series 26: run · 1 of 1 slices shown (19 of 19)]
[im 1/1]
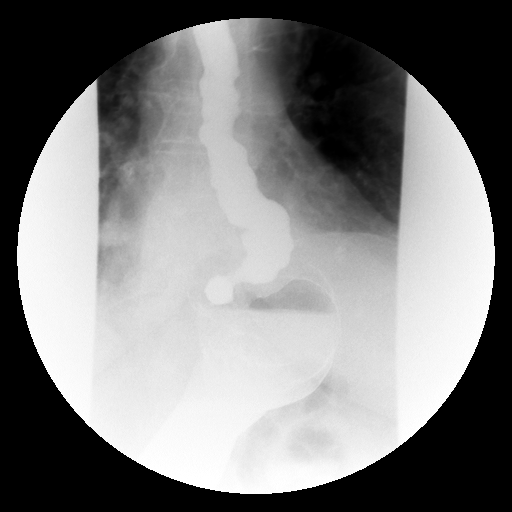

[19 of 24 positions shown; findings below may reference images not displayed]

FINDINGS: A preliminary film of the abdomen shows a moderate amount
of feces throughout the colon.  There are degenerative changes at
the L5-S1 level and the bones are osteopenic.

Initially rapid sequence spot films of the cervical esophagus were
performed.  The swallowing mechanism appears normal.  There does
appear to be prominence of the cricopharyngeus muscle noted.  There
is there is diminished primary peristaltic wave.  The distal
esophagus is somewhat tortuous and there does appear to be a small
para esophageal hernia present.  No definite reflux could be
demonstrated.  However, a barium pill was given at the end of the
study which did lodge within the narrowed distal esophagus
consistent with a distal esophageal short segment stricture.

The stomach is normal in contour peristalsis although not optimally
distended.  The duodenal bulb fills well and the duodenal loop is
in normal position.
IMPRESSION: 1. The barium pill lodges at the narrowed distal esophagus
consistent with short segment distal esophageal stricture.  No
definite reflux could be demonstrated.
2.  Small paraesophageal hiatal hernia.
3.  Diminished primary peristaltic wave with tertiary contractions.
4.  The stomach is not optimally distended, but no gastric or
duodenal abnormality is seen.
5.  Prominent cricopharyngeus muscle.

## 2012-09-04 DIAGNOSIS — S0100XA Unspecified open wound of scalp, initial encounter: Secondary | ICD-10-CM | POA: Diagnosis not present

## 2012-09-12 ENCOUNTER — Encounter: Payer: Self-pay | Admitting: Internal Medicine

## 2012-09-12 ENCOUNTER — Ambulatory Visit (INDEPENDENT_AMBULATORY_CARE_PROVIDER_SITE_OTHER): Payer: Medicare Other | Admitting: Internal Medicine

## 2012-09-12 VITALS — BP 98/50 | HR 81 | Temp 98.1°F | Ht 61.0 in | Wt 198.8 lb

## 2012-09-12 DIAGNOSIS — R05 Cough: Secondary | ICD-10-CM

## 2012-09-12 NOTE — Assessment & Plan Note (Signed)
 #  CHronic Cough - glad cough is only mild and stable  -  Continue generic fluticasone inhalers 2 squirts each nostril daily - continue symbicort but reduce to 1 puff twice daily; nurse will ensure enough refills  - if cough gets worse, then increase back to 2 puff twice dailu - follow  DR Mann  Advise on acid reflux issues  -important you follow advise 100%   - #Followup  - 12 months with cough score at followup   

## 2012-09-12 NOTE — Patient Instructions (Addendum)
 #  CHronic Cough - glad cough is only mild and stable  -  Continue generic fluticasone inhalers 2 squirts each nostril daily - continue symbicort but reduce to 1 puff twice daily; nurse will ensure enough refills  - if cough gets worse, then increase back to 2 puff twice dailu - follow  DR Loreta Ave  Advise on acid reflux issues  -important you follow advise 100%   - #Followup  - 12 months with cough score at followup

## 2012-09-12 NOTE — Progress Notes (Signed)
Subjective:    Patient ID: Connie Obrien, female    DOB: [birth year], 79 y.o.   MRN: [id number]  HPI OV 10/28/2010: Since visit 1 month ago saw NP for med calendar. Using QVAR 2 puff bid. Says with this cough 100% gone almost. Compliant with inhaler. Notices occ mild dry cough when exposed to newspaper, crowds, heat or fire but not as ssevere as before. DYspnea is improved too but still notices when climbs uphill, relieved by rest. Started rehab yesterday; effect not known yet. Following associated gerd advice through Dr. Loreta Ave  Past, Fam. Social reviewed: no changes from 09/22/2010  REC #Cough is from multiple reasons - bp pill lisinopril (now stopped), sinus, acid reflux, asthma and possibly cyclical cough  - Glad you are almost 100% better with addressing acid reflux, bp pill issue and starting QVAR asthma inhaler  - continue to follow all of Dr Kenna Gilbert advice for hiatal hernia and acid reflux issues  - for athma: continue qvar 2 puff twice daily with spacer device  #Shortness of breath  - glad you are better  - please continue pulmonary rehabilitation for this which is due to obesity, deconditioning and asthma  #Followup  - 6- Months or sooner if needed  OV 05/05/2011 FU cough. Reports bronchitis on 03/30/11. Rx Zpak and it did not work. Replaced later with 'stronger antibiotic' and initially thought she improved but cough recurred/worsened a few days later. So, visited PMD. CXR done - reportedly clear. Then given ventolin 04/26/11  (says was wheezing real bad) which she says helped but she at her own discretion stopped QVAR because she did not want to take 2 inhalers and was causing some soreness.  At this point still with cough. Currently RSI score is high - is 35 (level 5 - hoarseness, excess mucus, annoying cough, sensation of lump in throat and heart burn. Level 3 -clearing of throat. Level 4- cough after lying down, Level 3- choking episodes). She says even this high score is improved since  Jan 2013. Currently cough is made worse by perfumes, damp weather, cold air, windy, going outside, bending and eating. Also, she notes food comes back after eating and makes her cough but she is able to swallow pills. IN association, feels there is something in throat all the time and there is something stuck there and some mild wheezing.  No wheezing  Past, Family, Social reviewed: no change since last visit other than in HPI  REC think your cough is from sinus, hiatal hernia acid reflux and asthma  The asthma is acting up  Please take prednisone 40mg  once daily x 3 days, then 30mg  once daily x 3 days, then 20mg  once daily x 3 days, then prednisone 10mg  once daily x 3 days and stop  Please stop qvar  Please start symbicort 80/4.5 2 puff twice daily - take sample, script and show technique  - this inhaler you take daily twice  Please take ventolin as needed  Please see Dr Loreta Ave for acid reflux  REturn in 3 weeks  Cough score at followup   OV 05/31/2011 Followup cough. RSI cough score is 15 and is > 50% improvement (level 3 clearing throat, level 1 hoarseness, xcess mucus, difficulty swallowing, cough after lying down, breathing diifficulty, troublesome cough). In terms of asthma: feels symbicort 2 puff twice daily inhaler working real well for her. Not needing rescue inhaler at all.  In terms of hiatal hernia and GERD: saw Dr Loreta Ave and on 05/24/11 had  barium swallow - diagnosed with distal esophageal stricture and small hernia. Fu with DR Loreta Ave pending.  However, this morning some congestion in chest (upper). She is worried that she might be getting a cold (went to church yesterday). She does have sneezing past week. Currently not on antibiotics. Denies sputum. Or fever. Feels that when she goes to church she can get sick. She still feels that something is stuck in upper chest   #current congestion  Currently I do not see a need for antibiotics. If congestion gets worse, call us  #CHronic Cough  -  take generic fluticasone inhaler 2 squirts each nostril daily  - continue symbicort 2 puff twice daily  - follow with DR Loreta Ave and everything in terms of symptoms now depends on how much she can address the esophageal stricture problem  #Followup  - 6 weeks (glad you are better)  OV 07/15/2011  Cough improved. RSI score down to 9; Feels best ever. All level 1 mild symptoms in the 9 point scale. Feels allergy season is keeping her sinuses draining and causing cough. Using nasal steroid prn only. But using symbicort daily. Also saw Dr Loreta Ave and per hx sounds like she has loose sphincter and asked to eat slowly; this has helped cough and gerd symptoms. Complaints of itching; wants to see allergist for overall allergy eval esp with asthma, sinus issues as well  Past, Family, Social reviewed: no change since last visit  #CHronic Cough  -  Continue generic fluticasone inhaler 2 squirts each nostril daily - continue symbicort 2 puff twice daily - follow  DR Loreta Ave  Advise on acid reflux issues  -important you follow advise 100%   - will refer you to Dr Melba Coon our allergist for allergy issues related to cough; nurse will do order  #Followup  -  12 weeks (glad you are better) or sooner if needed    OV 08/27/11 with Dr Maple Hudson - 62 yoF never smoker, referred by Dr Marchelle Gearing for allergy evaluation. She complains of itching without visible rash. She is followed by Dr. Marchelle Gearing for asthma/bronchitis. Many years ago she was on allergy vaccine from Dr. Alleen Borne, indicating that she was atopic with positive skin tests at that time.  She now describes itching and burning which may go back as far as 2005. It is mostly involved her scalp, ears and nose. A dermatologist gave a cream which does not help. She has been using Elidel 1% cream at bedtime. She tried Zyrtec without benefit although it helps rhinitis and sneezing. She has no history of sensitivity to foods, cosmetics or aspirin. No prior history of hives. No  history of liver disease. She thinks exposure to animals makes it worse and nothing really benefits. Symptoms are persistent with some variation from day to day. She took prednisone at the end of March but is not sure that made any difference.  Her children have hayfever but otherwise there is no relevant family history. She is living alone, retired without unusual exposures.  10/12/11- 89 yoF never smoker referred by Dr Marchelle Gearing for allergy evaluation. She complains of itching without visible rash.  Pt here to discuss allergy labs Daughter here  Allergy profile-08/27/2011-negative with total IgE 6.7  No change in her generalized itching. Daily Zyrtec did not help. Elidel cream did not help. She last saw Dr. Lupton/dermatology 6 months ago. Today she demonstrates some very slightly pink nonraised areas on her arms where she itches.   REC Try using Head and Shoulders  shampoo as a body wash as well as a shampoo, for 2-3 weeks. If this itching is due to a skin fungus, the medicine in the shampoo should help.    OV 10/29/2011 Followup chronic cough. Dong well. Cough at best level ever. RSI cough score is 10.5 and lowest level; similar to before. She is happy with current quality of life. Kouffman differentiator suggests gerd still present. In fact, she complains of spitting mucus after eating. She has no new complaints.   REC  #CHronic Cough  -  Continue generic fluticasone inhaler 2 squirts each nostril daily - continue symbicort 2 puff twice daily; nurse will ensure enough refills - follow  DR Loreta Ave  Advise on acid reflux issues  -important you follow advise 100%   - #Followup  - 9 months with cough score at followup  OV 09/12/2012  FU cough  Doing well overall. Cough is stable and mild. Still gets irritaed when exposed to cold air. Takes symbicort bid and nasal steroid. SHe will be turning 89. No active complaints.   Past, Family, Social reviewed: recent fall with scalp laceration and s/p  suture. Says balance ok but not as strong as before     Dr Gretta Cool Reflux Symptom Index (> 13-15 suggestive of LPR cough) 0 -> 5  =  none ->severe problem 10/29/11 09/12/2012   Hoarseness of problem with voice 1 0  Clearing  Of Throat 2 1  Excess throat mucus or feeling of post nasal drip 2 2  Difficulty swallowing food, liquid or tablets 1 0  Cough after eating or lying down 1 1  Breathing difficulties or choking episodes 0 1  Troublesome or annoying cough 0 1  Sensation of something sticking in throat or lump in throat 1 1  Heartburn, chest pain, indigestion, or stomach acid coming up 2.5 2  TOTAL 10.5 and similar to before 9     Kouffman Reflux v Neurogenic Cough Differentiator Reflux 10/29/11 09/12/2012   Do you awaken from a sound sleep coughing violently?                            With trouble breathing?  n  Do you have choking episodes when you cannot  Get enough air, gasping for air ?               n  Do you usually cough when you lie down into  The bed, or when you just lie down to rest ?                          Yes n  Do you usually cough after meals or eating?         Yes n  Do you cough when (or after) you bend over?    Yes y  GERD SCORE  3 1  Kouffman Reflux v Neurogenic Cough Differentiator Neurogenic n  Do you more-or-less cough all day long?  n  Does change of temperature make you cough? yes yes  Does laughing or chuckling cause you to cough?  n  Do fumes (perfume, automobile fumes, burned  Toast, etc.,) cause you to cough ?      yes yes  Does speaking, singing, or talking on the phone cause you to cough   ?                n  Neurogenic/Airway score 2 2  Past, Family, Social reviewed: no change since last visit    Review of Systems  Constitutional: Negative for fever and unexpected weight change.  HENT: Negative for ear pain, nosebleeds, congestion, sore throat, rhinorrhea, sneezing, trouble swallowing, dental problem, postnasal drip and sinus pressure.    Eyes: Negative for redness and itching.  Respiratory: Positive for cough and wheezing. Negative for chest tightness and shortness of breath.   Cardiovascular: Negative for palpitations and leg swelling.  Gastrointestinal: Negative for nausea and vomiting.  Genitourinary: Negative for dysuria.  Musculoskeletal: Negative for joint swelling.  Skin: Negative for rash.  Neurological: Negative for headaches.  Hematological: Does not bruise/bleed easily.  Psychiatric/Behavioral: Negative for dysphoric mood. The patient is not nervous/anxious.    Current outpatient prescriptions:aspirin 81 MG tablet, Take 81 mg by mouth daily.  , Disp: , Rfl: ;  bimatoprost (LUMIGAN) 0.03 % ophthalmic solution, Place 1 drop into both eyes at bedtime.  , Disp: , Rfl: ;  budesonide-formoterol (SYMBICORT) 80-4.5 MCG/ACT inhaler, Inhale 2 puffs into the lungs 2 (two) times daily., Disp: 1 Inhaler, Rfl: 9 calcium citrate-vitamin D (CITRACAL+D) 315-200 MG-UNIT per tablet, Take 1 tablet by mouth 2 (two) times daily.  , Disp: , Rfl: ;  cetirizine (ZYRTEC) 10 MG tablet, Take 10 mg by mouth daily.  , Disp: , Rfl: ;  dorzolamide-timolol (COSOPT) 22.3-6.8 MG/ML ophthalmic solution, Place 1 drop into both eyes 2 (two) times daily., Disp: , Rfl: ;  ELIDEL 1 % cream, apply twice daily, Disp: , Rfl:  estradiol (ESTRACE) 0.1 MG/GM vaginal cream, Place 2 g vaginally 2 (two) times a week.  , Disp: , Rfl: ;  furosemide (LASIX) 40 MG tablet, Take 1 tablet by mouth Daily., Disp: , Rfl: ;  KLOR-CON M20 20 MEQ tablet, Take 1 tablet by mouth Daily., Disp: , Rfl: ;  losartan (COZAAR) 100 MG tablet, Take 100 mg by mouth daily. , Disp: , Rfl: ;  Nutritional Supplements (PRO-FLORA PLUS) CAPS, Take 1 capsule by mouth daily., Disp: , Rfl:  pravastatin (PRAVACHOL) 40 MG tablet, Take 1 tablet (40 mg total) by mouth at bedtime., Disp: , Rfl: ;  vitamin B-12 (CYANOCOBALAMIN) 500 MCG tablet, Take 500 mcg by mouth daily., Disp: , Rfl: ;  vitamin D,  CHOLECALCIFEROL, 400 UNITS tablet, Take 400 Units by mouth daily.  , Disp: , Rfl: ;  vitamin E 400 UNIT capsule, Take 400 Units by mouth daily.  , Disp: , Rfl:  fluticasone (FLONASE) 50 MCG/ACT nasal spray, Place 2 sprays into the nose daily., Disp: 16 g, Rfl: 6;  Polyethyl Glycol-Propyl Glycol (SYSTANE) 0.4-0.3 % SOLN, Place 1 drop into both eyes 2 (two) times daily., Disp: , Rfl: ;  VENTOLIN HFA 108 (90 BASE) MCG/ACT inhaler, Inhale 1 puff into the lungs Every 6 hours as needed., Disp: , Rfl:  [DISCONTINUED] beclomethasone (QVAR) 80 MCG/ACT inhaler, Inhale 2 puffs into the lungs 2 (two) times daily., Disp: 1 Inhaler, Rfl: 5     Objective:   Physical Exam  General- Alert, Oriented, Affect-appropriate, Distress- none acute Skin- faint pink areas on  Upper arms, cheeks and scalp above the hairline. No scaling or excoriation. Possibly from rubbing. Lymphadenopathy- none Head- atraumatic            Eyes- Gross vision intact, PERRLA, conjunctivae and secretions clear            Ears- Hearing, canals-normal            Nose- Clear, no-Septal dev, mucus, polyps, erosion, perforation  Throat- Mallampati III , mucosa clear , drainage- none, tonsils- atrophic, worn teeth with dental repair Neck- flexible , trachea midline, no stridor , thyroid nl, carotid no bruit Chest - symmetrical excursion , unlabored           Heart/CV- RRR , no murmur , no gallop  , no rub, nl s1 s2                           - JVD- none , edema- none, stasis changes- none, varices- none           Lung- clear to P&A, wheeze- none, cough- none , dullness-none, rub- none           Chest wall-  Abd-  Br/ Gen/ Rectal- Not done, not indicated Extrem- cyanosis- none, clubbing, none, atrophy- none, strength- nl Neuro- grossly intact to observation       Assessment & Plan:

## 2012-10-05 DIAGNOSIS — I10 Essential (primary) hypertension: Secondary | ICD-10-CM | POA: Diagnosis not present

## 2012-10-05 DIAGNOSIS — E785 Hyperlipidemia, unspecified: Secondary | ICD-10-CM | POA: Diagnosis not present

## 2012-10-05 DIAGNOSIS — G4733 Obstructive sleep apnea (adult) (pediatric): Secondary | ICD-10-CM | POA: Diagnosis not present

## 2012-10-05 DIAGNOSIS — I471 Supraventricular tachycardia: Secondary | ICD-10-CM | POA: Diagnosis not present

## 2012-11-06 DIAGNOSIS — E785 Hyperlipidemia, unspecified: Secondary | ICD-10-CM | POA: Diagnosis not present

## 2012-11-06 DIAGNOSIS — I10 Essential (primary) hypertension: Secondary | ICD-10-CM | POA: Diagnosis not present

## 2012-11-06 DIAGNOSIS — L851 Acquired keratosis [keratoderma] palmaris et plantaris: Secondary | ICD-10-CM | POA: Diagnosis not present

## 2012-11-06 DIAGNOSIS — R7309 Other abnormal glucose: Secondary | ICD-10-CM | POA: Diagnosis not present

## 2012-11-06 DIAGNOSIS — Z23 Encounter for immunization: Secondary | ICD-10-CM | POA: Diagnosis not present

## 2012-12-07 DIAGNOSIS — J029 Acute pharyngitis, unspecified: Secondary | ICD-10-CM | POA: Diagnosis not present

## 2012-12-07 DIAGNOSIS — J209 Acute bronchitis, unspecified: Secondary | ICD-10-CM | POA: Diagnosis not present

## 2012-12-12 DIAGNOSIS — M79609 Pain in unspecified limb: Secondary | ICD-10-CM | POA: Diagnosis not present

## 2012-12-12 DIAGNOSIS — J209 Acute bronchitis, unspecified: Secondary | ICD-10-CM | POA: Diagnosis not present

## 2013-01-02 DIAGNOSIS — H4010X0 Unspecified open-angle glaucoma, stage unspecified: Secondary | ICD-10-CM | POA: Diagnosis not present

## 2013-01-02 DIAGNOSIS — H18419 Arcus senilis, unspecified eye: Secondary | ICD-10-CM | POA: Diagnosis not present

## 2013-01-02 DIAGNOSIS — H3530 Unspecified macular degeneration: Secondary | ICD-10-CM | POA: Diagnosis not present

## 2013-01-02 DIAGNOSIS — Z961 Presence of intraocular lens: Secondary | ICD-10-CM | POA: Diagnosis not present

## 2013-01-02 DIAGNOSIS — H409 Unspecified glaucoma: Secondary | ICD-10-CM | POA: Diagnosis not present

## 2013-01-08 DIAGNOSIS — H35369 Drusen (degenerative) of macula, unspecified eye: Secondary | ICD-10-CM | POA: Diagnosis not present

## 2013-01-08 DIAGNOSIS — H353190 Nonexudative age-related macular degeneration, unspecified eye, stage unspecified: Secondary | ICD-10-CM | POA: Diagnosis not present

## 2013-01-08 DIAGNOSIS — H353290 Exudative age-related macular degeneration, unspecified eye, stage unspecified: Secondary | ICD-10-CM | POA: Diagnosis not present

## 2013-01-08 DIAGNOSIS — H43819 Vitreous degeneration, unspecified eye: Secondary | ICD-10-CM | POA: Diagnosis not present

## 2013-01-10 DIAGNOSIS — H43819 Vitreous degeneration, unspecified eye: Secondary | ICD-10-CM | POA: Diagnosis not present

## 2013-01-10 DIAGNOSIS — H353290 Exudative age-related macular degeneration, unspecified eye, stage unspecified: Secondary | ICD-10-CM | POA: Diagnosis not present

## 2013-01-10 DIAGNOSIS — H35369 Drusen (degenerative) of macula, unspecified eye: Secondary | ICD-10-CM | POA: Diagnosis not present

## 2013-01-10 DIAGNOSIS — H353190 Nonexudative age-related macular degeneration, unspecified eye, stage unspecified: Secondary | ICD-10-CM | POA: Diagnosis not present

## 2013-01-17 DIAGNOSIS — H35059 Retinal neovascularization, unspecified, unspecified eye: Secondary | ICD-10-CM | POA: Diagnosis not present

## 2013-01-17 DIAGNOSIS — H353290 Exudative age-related macular degeneration, unspecified eye, stage unspecified: Secondary | ICD-10-CM | POA: Diagnosis not present

## 2013-01-25 DIAGNOSIS — Z85820 Personal history of malignant melanoma of skin: Secondary | ICD-10-CM | POA: Diagnosis not present

## 2013-01-25 DIAGNOSIS — L723 Sebaceous cyst: Secondary | ICD-10-CM | POA: Diagnosis not present

## 2013-01-25 DIAGNOSIS — L219 Seborrheic dermatitis, unspecified: Secondary | ICD-10-CM | POA: Diagnosis not present

## 2013-01-25 DIAGNOSIS — B353 Tinea pedis: Secondary | ICD-10-CM | POA: Diagnosis not present

## 2013-01-25 DIAGNOSIS — L570 Actinic keratosis: Secondary | ICD-10-CM | POA: Diagnosis not present

## 2013-01-25 DIAGNOSIS — L821 Other seborrheic keratosis: Secondary | ICD-10-CM | POA: Diagnosis not present

## 2013-01-25 DIAGNOSIS — D239 Other benign neoplasm of skin, unspecified: Secondary | ICD-10-CM | POA: Diagnosis not present

## 2013-02-13 DIAGNOSIS — H35369 Drusen (degenerative) of macula, unspecified eye: Secondary | ICD-10-CM | POA: Diagnosis not present

## 2013-02-13 DIAGNOSIS — H353290 Exudative age-related macular degeneration, unspecified eye, stage unspecified: Secondary | ICD-10-CM | POA: Diagnosis not present

## 2013-02-13 DIAGNOSIS — H353190 Nonexudative age-related macular degeneration, unspecified eye, stage unspecified: Secondary | ICD-10-CM | POA: Diagnosis not present

## 2013-02-22 DIAGNOSIS — H353290 Exudative age-related macular degeneration, unspecified eye, stage unspecified: Secondary | ICD-10-CM | POA: Diagnosis not present

## 2013-02-22 DIAGNOSIS — H35059 Retinal neovascularization, unspecified, unspecified eye: Secondary | ICD-10-CM | POA: Diagnosis not present

## 2013-03-05 DIAGNOSIS — R7309 Other abnormal glucose: Secondary | ICD-10-CM | POA: Diagnosis not present

## 2013-03-05 DIAGNOSIS — L940 Localized scleroderma [morphea]: Secondary | ICD-10-CM | POA: Diagnosis not present

## 2013-03-05 DIAGNOSIS — E785 Hyperlipidemia, unspecified: Secondary | ICD-10-CM | POA: Diagnosis not present

## 2013-03-05 DIAGNOSIS — I10 Essential (primary) hypertension: Secondary | ICD-10-CM | POA: Diagnosis not present

## 2013-03-21 DIAGNOSIS — H35059 Retinal neovascularization, unspecified, unspecified eye: Secondary | ICD-10-CM | POA: Diagnosis not present

## 2013-03-21 DIAGNOSIS — H353290 Exudative age-related macular degeneration, unspecified eye, stage unspecified: Secondary | ICD-10-CM | POA: Diagnosis not present

## 2013-03-29 DIAGNOSIS — H353290 Exudative age-related macular degeneration, unspecified eye, stage unspecified: Secondary | ICD-10-CM | POA: Diagnosis not present

## 2013-03-29 DIAGNOSIS — H35059 Retinal neovascularization, unspecified, unspecified eye: Secondary | ICD-10-CM | POA: Diagnosis not present

## 2013-04-24 ENCOUNTER — Other Ambulatory Visit: Payer: Self-pay | Admitting: Gastroenterology

## 2013-04-24 DIAGNOSIS — R141 Gas pain: Secondary | ICD-10-CM | POA: Diagnosis not present

## 2013-04-24 DIAGNOSIS — R143 Flatulence: Secondary | ICD-10-CM | POA: Diagnosis not present

## 2013-04-24 DIAGNOSIS — R1033 Periumbilical pain: Secondary | ICD-10-CM

## 2013-04-24 DIAGNOSIS — R1032 Left lower quadrant pain: Secondary | ICD-10-CM | POA: Diagnosis not present

## 2013-04-24 DIAGNOSIS — K219 Gastro-esophageal reflux disease without esophagitis: Secondary | ICD-10-CM | POA: Diagnosis not present

## 2013-04-25 DIAGNOSIS — H35359 Cystoid macular degeneration, unspecified eye: Secondary | ICD-10-CM | POA: Diagnosis not present

## 2013-04-25 DIAGNOSIS — H353290 Exudative age-related macular degeneration, unspecified eye, stage unspecified: Secondary | ICD-10-CM | POA: Diagnosis not present

## 2013-04-25 DIAGNOSIS — H35059 Retinal neovascularization, unspecified, unspecified eye: Secondary | ICD-10-CM | POA: Diagnosis not present

## 2013-04-26 ENCOUNTER — Ambulatory Visit
Admission: RE | Admit: 2013-04-26 | Discharge: 2013-04-26 | Disposition: A | Discharge status: Home / Self Care | Payer: Medicare Other | Source: Ambulatory Visit | Attending: Gastroenterology | Admitting: Gastroenterology

## 2013-04-26 DIAGNOSIS — K7689 Other specified diseases of liver: Secondary | ICD-10-CM | POA: Diagnosis not present

## 2013-04-26 DIAGNOSIS — R1033 Periumbilical pain: Secondary | ICD-10-CM

## 2013-04-26 DIAGNOSIS — K5730 Diverticulosis of large intestine without perforation or abscess without bleeding: Secondary | ICD-10-CM | POA: Diagnosis not present

## 2013-04-26 MED ORDER — IOHEXOL 300 MG/ML  SOLN
125.0000 mL | Freq: Once | INTRAMUSCULAR | Status: AC | PRN
  Administered 2013-04-26: 125 mL via INTRAVENOUS

## 2013-05-03 DIAGNOSIS — H35059 Retinal neovascularization, unspecified, unspecified eye: Secondary | ICD-10-CM | POA: Diagnosis not present

## 2013-05-03 DIAGNOSIS — H353290 Exudative age-related macular degeneration, unspecified eye, stage unspecified: Secondary | ICD-10-CM | POA: Diagnosis not present

## 2013-05-22 DIAGNOSIS — N3000 Acute cystitis without hematuria: Secondary | ICD-10-CM | POA: Diagnosis not present

## 2013-05-22 DIAGNOSIS — R350 Frequency of micturition: Secondary | ICD-10-CM | POA: Diagnosis not present

## 2013-05-30 DIAGNOSIS — H35059 Retinal neovascularization, unspecified, unspecified eye: Secondary | ICD-10-CM | POA: Diagnosis not present

## 2013-05-30 DIAGNOSIS — H353290 Exudative age-related macular degeneration, unspecified eye, stage unspecified: Secondary | ICD-10-CM | POA: Diagnosis not present

## 2013-06-07 DIAGNOSIS — H35059 Retinal neovascularization, unspecified, unspecified eye: Secondary | ICD-10-CM | POA: Diagnosis not present

## 2013-06-07 DIAGNOSIS — H353290 Exudative age-related macular degeneration, unspecified eye, stage unspecified: Secondary | ICD-10-CM | POA: Diagnosis not present

## 2013-07-04 DIAGNOSIS — H35059 Retinal neovascularization, unspecified, unspecified eye: Secondary | ICD-10-CM | POA: Diagnosis not present

## 2013-07-04 DIAGNOSIS — H353290 Exudative age-related macular degeneration, unspecified eye, stage unspecified: Secondary | ICD-10-CM | POA: Diagnosis not present

## 2013-07-09 DIAGNOSIS — R350 Frequency of micturition: Secondary | ICD-10-CM | POA: Diagnosis not present

## 2013-07-09 DIAGNOSIS — Z1331 Encounter for screening for depression: Secondary | ICD-10-CM | POA: Diagnosis not present

## 2013-07-09 DIAGNOSIS — R7301 Impaired fasting glucose: Secondary | ICD-10-CM | POA: Diagnosis not present

## 2013-07-09 DIAGNOSIS — M545 Low back pain, unspecified: Secondary | ICD-10-CM | POA: Diagnosis not present

## 2013-07-09 DIAGNOSIS — I10 Essential (primary) hypertension: Secondary | ICD-10-CM | POA: Diagnosis not present

## 2013-07-09 DIAGNOSIS — Z23 Encounter for immunization: Secondary | ICD-10-CM | POA: Diagnosis not present

## 2013-07-09 DIAGNOSIS — L940 Localized scleroderma [morphea]: Secondary | ICD-10-CM | POA: Diagnosis not present

## 2013-07-09 DIAGNOSIS — E785 Hyperlipidemia, unspecified: Secondary | ICD-10-CM | POA: Diagnosis not present

## 2013-07-12 DIAGNOSIS — H35059 Retinal neovascularization, unspecified, unspecified eye: Secondary | ICD-10-CM | POA: Diagnosis not present

## 2013-07-12 DIAGNOSIS — H353290 Exudative age-related macular degeneration, unspecified eye, stage unspecified: Secondary | ICD-10-CM | POA: Diagnosis not present

## 2013-07-16 ENCOUNTER — Other Ambulatory Visit: Payer: Self-pay | Admitting: Internal Medicine

## 2013-07-17 DIAGNOSIS — H409 Unspecified glaucoma: Secondary | ICD-10-CM | POA: Diagnosis not present

## 2013-07-17 DIAGNOSIS — Z961 Presence of intraocular lens: Secondary | ICD-10-CM | POA: Diagnosis not present

## 2013-07-17 DIAGNOSIS — H4010X0 Unspecified open-angle glaucoma, stage unspecified: Secondary | ICD-10-CM | POA: Diagnosis not present

## 2013-07-17 DIAGNOSIS — H3530 Unspecified macular degeneration: Secondary | ICD-10-CM | POA: Diagnosis not present

## 2013-07-19 DIAGNOSIS — J45909 Unspecified asthma, uncomplicated: Secondary | ICD-10-CM | POA: Diagnosis not present

## 2013-08-09 ENCOUNTER — Other Ambulatory Visit: Payer: Self-pay | Admitting: Dermatology

## 2013-08-09 DIAGNOSIS — Z85820 Personal history of malignant melanoma of skin: Secondary | ICD-10-CM | POA: Diagnosis not present

## 2013-08-09 DIAGNOSIS — L570 Actinic keratosis: Secondary | ICD-10-CM | POA: Diagnosis not present

## 2013-08-09 DIAGNOSIS — D239 Other benign neoplasm of skin, unspecified: Secondary | ICD-10-CM | POA: Diagnosis not present

## 2013-08-09 DIAGNOSIS — L821 Other seborrheic keratosis: Secondary | ICD-10-CM | POA: Diagnosis not present

## 2013-08-09 DIAGNOSIS — D0430 Carcinoma in situ of skin of unspecified part of face: Secondary | ICD-10-CM | POA: Diagnosis not present

## 2013-08-09 DIAGNOSIS — D485 Neoplasm of uncertain behavior of skin: Secondary | ICD-10-CM | POA: Diagnosis not present

## 2013-08-09 DIAGNOSIS — D0439 Carcinoma in situ of skin of other parts of face: Secondary | ICD-10-CM | POA: Diagnosis not present

## 2013-08-10 DIAGNOSIS — I10 Essential (primary) hypertension: Secondary | ICD-10-CM | POA: Diagnosis not present

## 2013-08-10 DIAGNOSIS — G459 Transient cerebral ischemic attack, unspecified: Secondary | ICD-10-CM | POA: Diagnosis not present

## 2013-08-15 DIAGNOSIS — H35359 Cystoid macular degeneration, unspecified eye: Secondary | ICD-10-CM | POA: Diagnosis not present

## 2013-08-15 DIAGNOSIS — H35059 Retinal neovascularization, unspecified, unspecified eye: Secondary | ICD-10-CM | POA: Diagnosis not present

## 2013-08-15 DIAGNOSIS — H353290 Exudative age-related macular degeneration, unspecified eye, stage unspecified: Secondary | ICD-10-CM | POA: Diagnosis not present

## 2013-08-22 DIAGNOSIS — D0439 Carcinoma in situ of skin of other parts of face: Secondary | ICD-10-CM | POA: Diagnosis not present

## 2013-08-22 DIAGNOSIS — D0430 Carcinoma in situ of skin of unspecified part of face: Secondary | ICD-10-CM | POA: Diagnosis not present

## 2013-08-29 DIAGNOSIS — H35359 Cystoid macular degeneration, unspecified eye: Secondary | ICD-10-CM | POA: Diagnosis not present

## 2013-08-29 DIAGNOSIS — H353290 Exudative age-related macular degeneration, unspecified eye, stage unspecified: Secondary | ICD-10-CM | POA: Diagnosis not present

## 2013-09-05 DIAGNOSIS — H16229 Keratoconjunctivitis sicca, not specified as Sjogren's, unspecified eye: Secondary | ICD-10-CM | POA: Diagnosis not present

## 2013-09-05 DIAGNOSIS — H40039 Anatomical narrow angle, unspecified eye: Secondary | ICD-10-CM | POA: Diagnosis not present

## 2013-09-10 ENCOUNTER — Emergency Department (HOSPITAL_COMMUNITY)
Admission: EM | Admit: 2013-09-10 | Discharge: 2013-09-11 | Disposition: A | Discharge status: Home / Self Care | Payer: Medicare Other | Attending: Emergency Medicine | Admitting: Emergency Medicine

## 2013-09-10 ENCOUNTER — Encounter (HOSPITAL_COMMUNITY): Payer: Self-pay | Admitting: Emergency Medicine

## 2013-09-10 ENCOUNTER — Emergency Department (HOSPITAL_COMMUNITY): Payer: Medicare Other

## 2013-09-10 DIAGNOSIS — I10 Essential (primary) hypertension: Secondary | ICD-10-CM | POA: Insufficient documentation

## 2013-09-10 DIAGNOSIS — E669 Obesity, unspecified: Secondary | ICD-10-CM | POA: Insufficient documentation

## 2013-09-10 DIAGNOSIS — IMO0002 Reserved for concepts with insufficient information to code with codable children: Secondary | ICD-10-CM | POA: Insufficient documentation

## 2013-09-10 DIAGNOSIS — H9190 Unspecified hearing loss, unspecified ear: Secondary | ICD-10-CM | POA: Diagnosis not present

## 2013-09-10 DIAGNOSIS — G43909 Migraine, unspecified, not intractable, without status migrainosus: Secondary | ICD-10-CM | POA: Insufficient documentation

## 2013-09-10 DIAGNOSIS — M1990 Unspecified osteoarthritis, unspecified site: Secondary | ICD-10-CM | POA: Diagnosis not present

## 2013-09-10 DIAGNOSIS — R51 Headache: Secondary | ICD-10-CM | POA: Diagnosis not present

## 2013-09-10 DIAGNOSIS — K219 Gastro-esophageal reflux disease without esophagitis: Secondary | ICD-10-CM | POA: Diagnosis not present

## 2013-09-10 DIAGNOSIS — Z7982 Long term (current) use of aspirin: Secondary | ICD-10-CM | POA: Diagnosis not present

## 2013-09-10 DIAGNOSIS — J45909 Unspecified asthma, uncomplicated: Secondary | ICD-10-CM | POA: Insufficient documentation

## 2013-09-10 DIAGNOSIS — R42 Dizziness and giddiness: Secondary | ICD-10-CM | POA: Diagnosis not present

## 2013-09-10 DIAGNOSIS — R519 Headache, unspecified: Secondary | ICD-10-CM

## 2013-09-10 DIAGNOSIS — Z79899 Other long term (current) drug therapy: Secondary | ICD-10-CM | POA: Insufficient documentation

## 2013-09-10 LAB — BASIC METABOLIC PANEL
BUN: 14 mg/dL (ref 6–23)
CHLORIDE: 97 meq/L (ref 96–112)
CO2: 29 mEq/L (ref 19–32)
Calcium: 9.6 mg/dL (ref 8.4–10.5)
Creatinine, Ser: 0.74 mg/dL (ref 0.50–1.10)
GFR calc Af Amer: 84 mL/min — ABNORMAL LOW (ref >90)
GFR, EST NON AFRICAN AMERICAN: 72 mL/min — AB (ref >90)
GLUCOSE: 152 mg/dL — AB (ref 70–99)
POTASSIUM: 3.9 meq/L (ref 3.7–5.3)
SODIUM: 140 meq/L (ref 137–147)

## 2013-09-10 LAB — CBC WITH DIFFERENTIAL/PLATELET
BASOS PCT: 0 % (ref 0–1)
Basophils Absolute: 0 10*3/uL (ref 0.0–0.1)
EOS ABS: 0.2 10*3/uL (ref 0.0–0.7)
EOS PCT: 2 % (ref 0–5)
HEMATOCRIT: 42 % (ref 36.0–46.0)
HEMOGLOBIN: 14 g/dL (ref 12.0–15.0)
LYMPHS PCT: 24 % (ref 12–46)
Lymphs Abs: 2.1 10*3/uL (ref 0.7–4.0)
MCH: 32 pg (ref 26.0–34.0)
MCHC: 33.3 g/dL (ref 30.0–36.0)
MCV: 95.9 fL (ref 78.0–100.0)
MONOS PCT: 8 % (ref 3–12)
Monocytes Absolute: 0.7 10*3/uL (ref 0.1–1.0)
NEUTROS PCT: 66 % (ref 43–77)
Neutro Abs: 5.9 10*3/uL (ref 1.7–7.7)
Platelets: 199 10*3/uL (ref 150–400)
RBC: 4.38 MIL/uL (ref 3.87–5.11)
RDW: 13.6 % (ref 11.5–15.5)
WBC: 8.9 10*3/uL (ref 4.0–10.5)

## 2013-09-10 NOTE — Discharge Instructions (Signed)
Hypertension Hypertension, commonly called high blood pressure, is when the force of blood pumping through your arteries is too strong. Your arteries are the blood vessels that carry blood from your heart throughout your body. A blood pressure reading consists of a higher number over a lower number, such as 110/72. The higher number (systolic) is the pressure inside your arteries when your heart pumps. The lower number (diastolic) is the pressure inside your arteries when your heart relaxes. Ideally you want your blood pressure below 120/80. Hypertension forces your heart to work harder to pump blood. Your arteries may become narrow or stiff. Having hypertension puts you at risk for heart disease, stroke, and other problems.  RISK FACTORS Some risk factors for high blood pressure are controllable. Others are not.  Risk factors you cannot control include:   Race. You may be at higher risk if you are African American.  Age. Risk increases with age.  Gender. Men are at higher risk than women before age 45 years. After age 65, women are at higher risk than men. Risk factors you can control include:  Not getting enough exercise or physical activity.  Being overweight.  Getting too much fat, sugar, calories, or salt in your diet.  Drinking too much alcohol. SIGNS AND SYMPTOMS Hypertension does not usually cause signs or symptoms. Extremely high blood pressure (hypertensive crisis) may cause headache, anxiety, shortness of breath, and nosebleed. DIAGNOSIS  To check if you have hypertension, your health care provider will measure your blood pressure while you are seated, with your arm held at the level of your heart. It should be measured at least twice using the same arm. Certain conditions can cause a difference in blood pressure between your right and left arms. A blood pressure reading that is higher than normal on one occasion does not mean that you need treatment. If one blood pressure reading  is high, ask your health care provider about having it checked again. TREATMENT  Treating high blood pressure includes making lifestyle changes and possibly taking medication. Living a healthy lifestyle can help lower high blood pressure. You may need to change some of your habits. Lifestyle changes may include:  Following the DASH diet. This diet is high in fruits, vegetables, and whole grains. It is low in salt, red meat, and added sugars.  Getting at least 2 1/2 hours of brisk physical activity every week.  Losing weight if necessary.  Not smoking.  Limiting alcoholic beverages.  Learning ways to reduce stress. If lifestyle changes are not enough to get your blood pressure under control, your health care provider may prescribe medicine. You may need to take more than one. Work closely with your health care provider to understand the risks and benefits. HOME CARE INSTRUCTIONS  Have your blood pressure rechecked as directed by your health care provider.   Only take medicine as directed by your health care provider. Follow the directions carefully. Blood pressure medicines must be taken as prescribed. The medicine does not work as well when you skip doses. Skipping doses also puts you at risk for problems.   Do not smoke.   Monitor your blood pressure at home as directed by your health care provider. SEEK MEDICAL CARE IF:   You think you are having a reaction to medicines taken.  You have recurrent headaches or feel dizzy.  You have swelling in your ankles.  You have trouble with your vision. SEEK IMMEDIATE MEDICAL CARE IF:  You develop a severe headache or   confusion.  You have unusual weakness, numbness, or feel faint.  You have severe chest or abdominal pain.  You vomit repeatedly.  You have trouble breathing. MAKE SURE YOU:   Understand these instructions.  Will watch your condition.  Will get help right away if you are not doing well or get  worse. Document Released: 03/08/2005 Document Revised: 03/13/2013 Document Reviewed: 12/29/2012 [employer] Patient Information 2015 [employer]. This information is not intended to replace advice given to you by your health care provider. Make sure you discuss any questions you have with your health care provider.  

## 2013-09-10 NOTE — ED Provider Notes (Signed)
CSN: [id number]     Arrival date & time 09/10/13  1832 History   First MD Initiated Contact with Patient 09/10/13 2302     Chief Complaint  Patient presents with  . Hypertension     (Consider location/radiation/quality/duration/timing/severity/associated sxs/prior Treatment) Patient is a 80 y.o. female presenting with hypertension. The history is provided by the patient.  Hypertension This is a new problem. The current episode started 6 to 12 hours ago. The problem occurs constantly. Associated symptoms include headaches. Pertinent negatives include no chest pain and no abdominal pain. Nothing aggravates the symptoms. Nothing relieves the symptoms.    Past Medical History  Diagnosis Date  . Obesity   . Osteoarthritis   . Scoliosis   . Glaucoma   . Deaf   . Allergic rhinitis   . Diverticulosis   . Asthma   . HTN (hypertension)   . Osteopenia   . GERD (gastroesophageal reflux disease)   . IBS (irritable bowel syndrome)    Past Surgical History  Procedure Laterality Date  . Total abdominal hysterectomy    . Abdominal hernia repair    . Appendectomy    . Colon resection    . Cataract extraction    . Hemorrhoid surgery    . Breast biopsy    . Nissen fundoplication     Family History  Problem Relation Age of Onset  . Coronary artery disease Father   . Diabetes Father   . Coronary artery disease Mother   . Coronary artery disease Brother   . Prostate cancer Brother     x2   History  Substance Use Topics  . Smoking status: Never Smoker   . Smokeless tobacco: Never Used  . Alcohol Use: No   OB History   Grav Para Term Preterm Abortions TAB SAB Ect Mult Living                 Review of Systems  Constitutional: Negative for fever and chills.  Cardiovascular: Negative for chest pain.  Gastrointestinal: Negative for abdominal pain.  Neurological: Positive for headaches.  All other systems reviewed and are negative.     Allergies  Septra and Sulfa  antibiotics  Home Medications   Prior to Admission medications   Medication Sig Start Date End Date Taking? Authorizing Provider  aspirin 325 MG EC tablet Take 325 mg by mouth daily.   Yes Historical Provider, MD  bimatoprost (LUMIGAN) 0.03 % ophthalmic solution Place 1 drop into both eyes at bedtime.     Yes Historical Provider, MD  budesonide-formoterol (SYMBICORT) 160-4.5 MCG/ACT inhaler Inhale 2 puffs into the lungs 2 (two) times daily.   Yes Historical Provider, MD  calcium citrate-vitamin D (CITRACAL+D) 315-200 MG-UNIT per tablet Take 1 tablet by mouth 2 (two) times daily.     Yes Historical Provider, MD  cetirizine (ZYRTEC) 10 MG tablet Take 10 mg by mouth daily.     Yes Historical Provider, MD  cycloSPORINE (RESTASIS) 0.05 % ophthalmic emulsion Place 1 drop into both eyes 2 (two) times daily.   Yes Historical Provider, MD  dorzolamide-timolol (COSOPT) 22.3-6.8 MG/ML ophthalmic solution Place 1 drop into both eyes 2 (two) times daily.   Yes Historical Provider, MD  estradiol (ESTRACE) 0.1 MG/GM vaginal cream Place 2 g vaginally 2 (two) times a week.     Yes Historical Provider, MD  fluticasone (FLONASE) 50 MCG/ACT nasal spray Place 2 sprays into the nose daily. 05/31/11 09/10/13 Yes Brand Males, MD  furosemide (LASIX) 40 MG tablet  Take 40 mg by mouth Daily.  04/21/11  Yes Historical Provider, MD  KLOR-CON M20 20 MEQ tablet Take 20 mEq by mouth daily.  03/12/11  Yes Historical Provider, MD  losartan (COZAAR) 100 MG tablet Take 100 mg by mouth daily.  08/07/11  Yes Historical Provider, MD  Nutritional Supplements (PRO-FLORA PLUS) CAPS Take 1 capsule by mouth daily. 10/02/10  Yes Tammy S Parrett, NP  Polyvinyl Alcohol-Povidone (REFRESH OP) Place 1 drop into both eyes daily as needed (dry eyes).   Yes Historical Provider, MD  pravastatin (PRAVACHOL) 40 MG tablet Take 1 tablet (40 mg total) by mouth at bedtime. 10/02/10  Yes Tammy S Parrett, NP  VENTOLIN HFA 108 (90 BASE) MCG/ACT inhaler Inhale  1 puff into the lungs Every 6 hours as needed for wheezing or shortness of breath.  04/26/11  Yes Historical Provider, MD  vitamin B-12 (CYANOCOBALAMIN) 500 MCG tablet Take 500 mcg by mouth daily.   Yes Historical Provider, MD  vitamin D, CHOLECALCIFEROL, 400 UNITS tablet Take 400 Units by mouth daily.     Yes Historical Provider, MD  vitamin E 400 UNIT capsule Take 400 Units by mouth daily.     Yes Historical Provider, MD   BP 159/78  Pulse 90  Temp(Src) 98.8 F (37.1 C) (Oral)  Resp 16  Ht 5' (1.524 m)  Wt 192 lb 11.2 oz (87.408 kg)  BMI 37.63 kg/m2  SpO2 92% Physical Exam  Nursing note and vitals reviewed. Constitutional: She is oriented to person, place, and time. She appears well-developed and well-nourished. No distress.  HENT:  Head: Normocephalic and atraumatic.  Mouth/Throat: Oropharynx is clear and moist.  Eyes: EOM are normal. Pupils are equal, round, and reactive to light.  Neck: Normal range of motion. Neck supple.  Cardiovascular: Normal rate and regular rhythm.  Exam reveals no friction rub.   No murmur heard. Pulmonary/Chest: Effort normal and breath sounds normal. No respiratory distress. She has no wheezes. She has no rales.  Abdominal: Soft. She exhibits no distension. There is no tenderness. There is no rebound.  Musculoskeletal: Normal range of motion. She exhibits no edema.  Neurological: She is alert and oriented to person, place, and time.  Skin: She is not diaphoretic.    ED Course  Procedures (including critical care time) Labs Review Labs Reviewed  BASIC METABOLIC PANEL - Abnormal; Notable for the following:    Glucose, Bld 152 (*)    GFR calc non Af Amer 72 (*)    GFR calc Af Amer 84 (*)    All other components within normal limits  CBC WITH DIFFERENTIAL    Imaging Review Ct Head Wo Contrast  09/10/2013   CLINICAL DATA:  Dizziness.  Hypertension.  EXAM: CT HEAD WITHOUT CONTRAST  TECHNIQUE: Contiguous axial images were obtained from the base of  the skull through the vertex without intravenous contrast.  COMPARISON:  MRI dated 06/26/2009  FINDINGS: No mass lesion. No midline shift. No acute hemorrhage or hematoma. No extra-axial fluid collections. No evidence of acute infarction. There is slight cerebral cortical atrophy with a few tiny lucencies in the basal ganglia consistent with small vessel disease. No acute osseous abnormality. No change since the prior MRI.  IMPRESSION: No acute abnormalities.   Electronically Signed   By: Rozetta Nunnery M.D.   On: 09/10/2013 20:33     EKG Interpretation None      MDM   Final diagnoses:  Acute nonintractable headache, unspecified headache type  Essential hypertension  80 year old female presents from home with headache. Started this afternoon, not a gradual onset.today, but did have over short amount of time. No thunderclap onset. At all she was doing laundry. No confusion, dysarthria, aphasia, but was having difficulty with word finding. She is given Tylenol with almost resolution. Daughter checked her blood pressure during this and her pressures were in the 202R to 427 systolic. She has a history of hypertension and is currently taking medication for it. No increase or decrease in her meds. She denies any chest pain, shortness of breath, numbness, tingling. No neurologic symptoms during her headache other than the mild trouble finding words. Here vitals are stable. Initial blood pressure 188/89, during my exam it's in the 160s over 80s. Patient's exam is benign. Head CT is normal. Labs are normal. I discussed the headache with the patient. I was concerned for possible subarachnoid bleed/sentinel bleed with headache. I discussed that her head CT was normal we would need to do LP to have 062% certainty is not a bleed. I discussed what the procedure would entail, risks and benefits with the patient and her daughters. Patient does not want to wait any longer and is feeling better. Patient is of sound  mind, alert and oriented. She does not want to proceed with a lumbar puncture. I understand her position and informed her that I recommend proceeding with the LP but I cannot make her. Patient will followup with her doctor tomorrow. She stable for discharge. She and her daughters are comfortable with this plan.     Osvaldo Shipper, MD 09/11/13 [phone]

## 2013-09-10 NOTE — ED Notes (Addendum)
Patient from home, family called PCP with hypertension readings of 212/110 during the day.  Patient does have a headache, she does have photophobia with the headache.  Patient was given two tylenol before coming to ED.  Patient does have some nausea, denies any CP, shortness of breath.  Patient had same episode 5 weeks ago.  Patient does admit to dizziness.

## 2013-09-26 DIAGNOSIS — H35059 Retinal neovascularization, unspecified, unspecified eye: Secondary | ICD-10-CM | POA: Diagnosis not present

## 2013-09-26 DIAGNOSIS — H35359 Cystoid macular degeneration, unspecified eye: Secondary | ICD-10-CM | POA: Diagnosis not present

## 2013-09-26 DIAGNOSIS — H353290 Exudative age-related macular degeneration, unspecified eye, stage unspecified: Secondary | ICD-10-CM | POA: Diagnosis not present

## 2013-10-08 DIAGNOSIS — E785 Hyperlipidemia, unspecified: Secondary | ICD-10-CM | POA: Diagnosis not present

## 2013-10-08 DIAGNOSIS — M545 Low back pain, unspecified: Secondary | ICD-10-CM | POA: Diagnosis not present

## 2013-10-08 DIAGNOSIS — R300 Dysuria: Secondary | ICD-10-CM | POA: Diagnosis not present

## 2013-10-08 DIAGNOSIS — L940 Localized scleroderma [morphea]: Secondary | ICD-10-CM | POA: Diagnosis not present

## 2013-10-08 DIAGNOSIS — I10 Essential (primary) hypertension: Secondary | ICD-10-CM | POA: Diagnosis not present

## 2013-10-08 DIAGNOSIS — N3090 Cystitis, unspecified without hematuria: Secondary | ICD-10-CM | POA: Diagnosis not present

## 2013-10-11 DIAGNOSIS — H35359 Cystoid macular degeneration, unspecified eye: Secondary | ICD-10-CM | POA: Diagnosis not present

## 2013-10-11 DIAGNOSIS — H353290 Exudative age-related macular degeneration, unspecified eye, stage unspecified: Secondary | ICD-10-CM | POA: Diagnosis not present

## 2013-10-26 ENCOUNTER — Ambulatory Visit: Payer: Medicare Other | Admitting: Internal Medicine

## 2013-10-26 ENCOUNTER — Ambulatory Visit (INDEPENDENT_AMBULATORY_CARE_PROVIDER_SITE_OTHER): Payer: Medicare Other | Admitting: Internal Medicine

## 2013-10-26 ENCOUNTER — Encounter: Payer: Self-pay | Admitting: Internal Medicine

## 2013-10-26 VITALS — BP 144/82 | HR 88 | Ht 59.0 in | Wt 193.0 lb

## 2013-10-26 DIAGNOSIS — R05 Cough: Secondary | ICD-10-CM | POA: Diagnosis not present

## 2013-10-26 DIAGNOSIS — R059 Cough, unspecified: Secondary | ICD-10-CM

## 2013-10-26 NOTE — Patient Instructions (Addendum)
#  CHronic Cough -  On basis of sinus drainage, asthma and hiatal hernia - glad cough is only mild and stable  -  Continue generic fluticasone inhalers 2 squirts each nostril daily - continue symbicort but reduce to 1 puff twice daily; nurse will ensure enough refills  - if cough gets worse, then increase back to 2 puff twice dailu - follow  DR Collene Mares  Advise on acid reflux issues  -important you follow advise 100%   - #Followup  - prn

## 2013-10-26 NOTE — Progress Notes (Signed)
Subjective:    Patient ID: Connie Obrien, female    DOB: [birth year], 80 y.o.   MRN: [id number]  HPI  OV 10/28/2010: Since visit 1 month ago saw NP for med calendar. Using QVAR 2 puff bid. Says with this cough 100% gone almost. Compliant with inhaler. Notices occ mild dry cough when exposed to newspaper, crowds, heat or fire but not as ssevere as before. DYspnea is improved too but still notices when climbs uphill, relieved by rest. Started rehab yesterday; effect not known yet. Following associated gerd advice through Dr. Collene Mares  Past, Fam. Social reviewed: no changes from 09/22/2010  REC #Cough is from multiple reasons - bp pill lisinopril (now stopped), sinus, acid reflux, asthma and possibly cyclical cough  - Glad you are almost 100% better with addressing acid reflux, bp pill issue and starting QVAR asthma inhaler  - continue to follow all of Dr Lorie Apley advice for hiatal hernia and acid reflux issues  - for athma: continue qvar 15mcg 2 puff twice daily with spacer device  #Shortness of breath  - glad you are better  - please continue pulmonary rehabilitation for this which is due to obesity, deconditioning and asthma  #Followup  - 6- Months or sooner if needed  OV 05/05/2011 FU cough. Reports bronchitis on 03/30/11. Rx Zpak and it did not work. Replaced later with 'stronger antibiotic' and initially thought she improved but cough recurred/worsened a few days later. So, visited PMD. CXR done - reportedly clear. Then given ventolin 04/26/11  (says was wheezing real bad) which she says helped but she at her own discretion stopped QVAR because she did not want to take 2 inhalers and was causing some soreness.  At this point still with cough. Currently RSI score is high - is 35 (level 5 - hoarseness, excess mucus, annoying cough, sensation of lump in throat and heart burn. Level 3 -clearing of throat. Level 4- cough after lying down, Level 3- choking episodes). She says even this high score is improved  since Jan 2013. Currently cough is made worse by perfumes, damp weather, cold air, windy, going outside, bending and eating. Also, she notes food comes back after eating and makes her cough but she is able to swallow pills. IN association, feels there is something in throat all the time and there is something stuck there and some mild wheezing.  No wheezing  Past, Family, Social reviewed: no change since last visit other than in HPI  REC think your cough is from sinus, hiatal hernia acid reflux and asthma  The asthma is acting up  Please take prednisone 40mg  once daily x 3 days, then 30mg  once daily x 3 days, then 20mg  once daily x 3 days, then prednisone 10mg  once daily x 3 days and stop  Please stop qvar  Please start symbicort 80/4.5 2 puff twice daily - take sample, script and show technique  - this inhaler you take daily twice  Please take ventolin as needed  Please see Dr Collene Mares for acid reflux  REturn in 3 weeks  Cough score at followup   OV 05/31/2011 Followup cough. RSI cough score is 15 and is > 50% improvement (level 3 clearing throat, level 1 hoarseness, xcess mucus, difficulty swallowing, cough after lying down, breathing diifficulty, troublesome cough). In terms of asthma: feels symbicort 2 puff twice daily inhaler working real well for her. Not needing rescue inhaler at all.  In terms of hiatal hernia and GERD: saw Dr Collene Mares and on  05/24/11 had barium swallow - diagnosed with distal esophageal stricture and small hernia. Fu with DR Collene Mares pending.  However, this morning some congestion in chest (upper). She is worried that she might be getting a cold (went to church yesterday). She does have sneezing past week. Currently not on antibiotics. Denies sputum. Or fever. Feels that when she goes to church she can get sick. She still feels that something is stuck in upper chest   #current congestion  Currently I do not see a need for antibiotics. If congestion gets worse, call us  #CHronic  Cough  - take generic fluticasone inhaler 2 squirts each nostril daily  - continue symbicort 2 puff twice daily  - follow with DR Collene Mares and everything in terms of symptoms now depends on how much she can address the esophageal stricture problem  #Followup  - 6 weeks (glad you are better)  OV 07/15/2011  Cough improved. RSI score down to 9; Feels best ever. All level 1 mild symptoms in the 9 point scale. Feels allergy season is keeping her sinuses draining and causing cough. Using nasal steroid prn only. But using symbicort daily. Also saw Dr Collene Mares and per hx sounds like she has loose sphincter and asked to eat slowly; this has helped cough and gerd symptoms. Complaints of itching; wants to see allergist for overall allergy eval esp with asthma, sinus issues as well  Past, Family, Social reviewed: no change since last visit  #CHronic Cough  -  Continue generic fluticasone inhaler 2 squirts each nostril daily - continue symbicort 2 puff twice daily - follow  DR Collene Mares  Advise on acid reflux issues  -important you follow advise 100%   - will refer you to Dr Glynn Octave our allergist for allergy issues related to cough; nurse will do order  #Followup  -  12 weeks (glad you are better) or sooner if needed    OV 08/27/11 with Dr Annamaria Boots - 54 yoF never smoker, referred by Dr Chase Caller for allergy evaluation. She complains of itching without visible rash. She is followed by Dr. Chase Caller for asthma/bronchitis. Many years ago she was on allergy vaccine from Dr. Timmothy Sours, indicating that she was atopic with positive skin tests at that time.  She now describes itching and burning which may go back as far as 2005. It is mostly involved her scalp, ears and nose. A dermatologist gave a cream which does not help. She has been using Elidel 1% cream at bedtime. She tried Zyrtec without benefit although it helps rhinitis and sneezing. She has no history of sensitivity to foods, cosmetics or aspirin. No prior history of  hives. No history of liver disease. She thinks exposure to animals makes it worse and nothing really benefits. Symptoms are persistent with some variation from day to day. She took prednisone at the end of March but is not sure that made any difference.  Her children have hayfever but otherwise there is no relevant family history. She is living alone, retired without unusual exposures.  10/12/11- 63 yoF never smoker referred by Dr Chase Caller for allergy evaluation. She complains of itching without visible rash.  Pt here to discuss allergy labs Daughter here  Allergy profile-08/27/2011-negative with total IgE 6.7  No change in her generalized itching. Daily Zyrtec did not help. Elidel cream did not help. She last saw Dr. Lupton/dermatology 6 months ago. Today she demonstrates some very slightly pink nonraised areas on her arms where she itches.   REC Try using Head  and Shoulders shampoo as a body wash as well as a shampoo, for 2-3 weeks. If this itching is due to a skin fungus, the medicine in the shampoo should help.    OV 10/29/2011 Followup chronic cough. Dong well. Cough at best level ever. RSI cough score is 10.5 and lowest level; similar to before. She is happy with current quality of life. Kouffman differentiator suggests gerd still present. In fact, she complains of spitting mucus after eating. She has no new complaints.   REC  #CHronic Cough  -  Continue generic fluticasone inhaler 2 squirts each nostril daily - continue symbicort 2 puff twice daily; nurse will ensure enough refills - follow  DR Collene Mares  Advise on acid reflux issues  -important you follow advise 100%   - #Followup  - 9 months with cough score at followup  OV 09/12/2012  FU cough  Doing well overall. Cough is stable and mild. Still gets irritaed when exposed to cold air. Takes symbicort bid and nasal steroid. SHe will be turning 89. No active complaints.   Past, Family, Social reviewed: recent fall with scalp  laceration and s/p suture. Says balance ok but not as strong as before  #CHronic Cough - glad cough is only mild and stable  -  Continue generic fluticasone inhalers 2 squirts each nostril daily - continue symbicort but reduce to 1 puff twice daily; nurse will ensure enough refills  - if cough gets worse, then increase back to 2 puff twice dailu - follow  DR Collene Mares  Advise on acid reflux issues  -important you follow advise 100%   - #Followup  - 12 months with cough score at followup   OV 10/26/2013  Chief Complaint  Patient presents with  . Follow-up    Pt states her breathing has improved since last visit. Pt states she only becomes dyspneic when walking longer distances. C/o mild dry cough and pain under left breast when lying down.    Followup chronic cough on basis of chronic sinus drainage, cough. Asthma, acid reflux related to hiatal hernia  This is a one year followup for cough. Overall her health and the last one it has been stable. She had one emergency room visit for high blood pressure but otherwise no admissions or any complications. She continues to have overall good health with only mild cough that is actually better in severity compared to one year ago. She still does have acid reflux issues particularly when she is not careful with the swallowing and chewing mechanisms. Other than this there are no complaints. She is compliant with her nasal steroid, oral inhaled steroid and acid reflux therapy.   Past hx - reviewed   Review of Systems  Constitutional: Negative for fever and unexpected weight change.  HENT: Negative for congestion, dental problem, ear pain, nosebleeds, postnasal drip, rhinorrhea, sinus pressure, sneezing, sore throat and trouble swallowing.   Eyes: Negative for redness and itching.  Respiratory: Positive for cough and shortness of breath. Negative for chest tightness and wheezing.   Cardiovascular: Positive for chest pain and leg swelling. Negative for  palpitations.  Gastrointestinal: Negative for nausea and vomiting.  Genitourinary: Negative for dysuria.  Musculoskeletal: Negative for joint swelling.  Skin: Negative for rash.  Neurological: Negative for headaches.  Hematological: Does not bruise/bleed easily.  Psychiatric/Behavioral: Negative for dysphoric mood. The patient is not nervous/anxious.    Current outpatient prescriptions:aspirin 325 MG EC tablet, Take 325 mg by mouth daily., Disp: , Rfl: ;  bimatoprost (  LUMIGAN) 0.03 % ophthalmic solution, Place 1 drop into both eyes at bedtime.  , Disp: , Rfl: ;  budesonide-formoterol (SYMBICORT) 160-4.5 MCG/ACT inhaler, Inhale 2 puffs into the lungs 2 (two) times daily., Disp: , Rfl:  calcium citrate-vitamin D (CITRACAL+D) 315-200 MG-UNIT per tablet, Take 1 tablet by mouth 2 (two) times daily. , Disp: , Rfl: ;  cetirizine (ZYRTEC) 10 MG tablet, Take 10 mg by mouth daily.  , Disp: , Rfl: ;  ciprofloxacin (CIPRO) 250 MG tablet, Take 1 tablet by mouth 2 (two) times daily., Disp: , Rfl: ;  cycloSPORINE (RESTASIS) 0.05 % ophthalmic emulsion, Place 1 drop into both eyes 2 (two) times daily., Disp: , Rfl:  dorzolamide-timolol (COSOPT) 22.3-6.8 MG/ML ophthalmic solution, Place 1 drop into both eyes 2 (two) times daily., Disp: , Rfl: ;  estradiol (ESTRACE) 0.1 MG/GM vaginal cream, Place 2 g vaginally as needed. , Disp: , Rfl: ;  furosemide (LASIX) 40 MG tablet, Take 40 mg by mouth Daily. , Disp: , Rfl: ;  KLOR-CON M20 20 MEQ tablet, Take 20 mEq by mouth daily. , Disp: , Rfl: ;  losartan (COZAAR) 100 MG tablet, Take 100 mg by mouth daily. , Disp: , Rfl:  Nutritional Supplements (PRO-FLORA PLUS) CAPS, Take 1 capsule by mouth daily., Disp: , Rfl: ;  Polyvinyl Alcohol-Povidone (REFRESH OP), Place 1 drop into both eyes daily as needed (dry eyes)., Disp: , Rfl: ;  pravastatin (PRAVACHOL) 40 MG tablet, Take 1 tablet (40 mg total) by mouth at bedtime., Disp: , Rfl:  VENTOLIN HFA 108 (90 BASE) MCG/ACT inhaler, Inhale 1  puff into the lungs Every 6 hours as needed for wheezing or shortness of breath. , Disp: , Rfl: ;  vitamin B-12 (CYANOCOBALAMIN) 500 MCG tablet, Take 500 mcg by mouth daily., Disp: , Rfl: ;  vitamin D, CHOLECALCIFEROL, 400 UNITS tablet, Take 400 Units by mouth daily.  , Disp: , Rfl: ;  vitamin E 400 UNIT capsule, Take 400 Units by mouth daily.  , Disp: , Rfl:  fluticasone (FLONASE) 50 MCG/ACT nasal spray, Place 2 sprays into the nose daily., Disp: 16 g, Rfl: 6;  [DISCONTINUED] beclomethasone (QVAR) 80 MCG/ACT inhaler, Inhale 2 puffs into the lungs 2 (two) times daily., Disp: 1 Inhaler, Rfl: 5      Objective:   Physical Exam  Vitals reviewed. Constitutional: She is oriented to person, place, and time. She appears well-developed and well-nourished. No distress.  Body mass index is 38.96 kg/(m^2).   HENT:  Head: Normocephalic and atraumatic.  Right Ear: External ear normal.  Left Ear: External ear normal.  Mouth/Throat: Oropharynx is clear and moist. No oropharyngeal exudate.  Eyes: Conjunctivae and EOM are normal. Pupils are equal, round, and reactive to light. Right eye exhibits no discharge. Left eye exhibits no discharge. No scleral icterus.  Neck: Normal range of motion. Neck supple. No JVD present. No tracheal deviation present. No thyromegaly present.  Cardiovascular: Normal rate, regular rhythm, normal heart sounds and intact distal pulses.  Exam reveals no gallop and no friction rub.   No murmur heard. Pulmonary/Chest: Effort normal and breath sounds normal. No respiratory distress. She has no wheezes. She has no rales. She exhibits no tenderness.  Abdominal: Soft. Bowel sounds are normal. She exhibits no distension and no mass. There is no tenderness. There is no rebound and no guarding.  Visceral obesity +  Musculoskeletal: Normal range of motion. She exhibits no edema and no tenderness.  Lymphadenopathy:    She has no cervical adenopathy.  Neurological: She is alert and oriented to  person, place, and time. She has normal reflexes. No cranial nerve deficit. She exhibits normal muscle tone. Coordination normal.  Skin: Skin is warm and dry. No rash noted. She is not diaphoretic. No erythema. No pallor.  Psychiatric: She has a normal mood and affect. Her behavior is normal. Judgment and thought content normal.    Filed Vitals:   10/26/13 1349  BP: 144/82  Pulse: 88  Height: 4\' 11"  (1.499 m)  Weight: 193 lb (87.544 kg)  SpO2: 96%         Assessment & Plan:  #CHronic Cough -  On basis of sinus drainage, asthma and hiatal hernia - glad cough is only mild and stable  -  Continue generic fluticasone inhalers 2 squirts each nostril daily - continue symbicort but reduce to 1 puff twice daily; nurse will ensure enough refills  - if cough gets worse, then increase back to 2 puff twice dailu - follow  DR Collene Mares  Advise on acid reflux issues  -important you follow advise 100%   - #Followup  - prn

## 2013-10-27 NOTE — Assessment & Plan Note (Signed)
#  CHronic Cough -  On basis of sinus drainage, asthma and hiatal hernia - glad cough is only mild and stable  -  Continue generic fluticasone inhalers 2 squirts each nostril daily - continue symbicort but reduce to 1 puff twice daily; nurse will ensure enough refills  - if cough gets worse, then increase back to 2 puff twice dailu - follow  DR Collene Mares  Advise on acid reflux issues  -important you follow advise 100%   - #Followup  - prn

## 2013-10-31 DIAGNOSIS — H353290 Exudative age-related macular degeneration, unspecified eye, stage unspecified: Secondary | ICD-10-CM | POA: Diagnosis not present

## 2013-10-31 DIAGNOSIS — H35059 Retinal neovascularization, unspecified, unspecified eye: Secondary | ICD-10-CM | POA: Diagnosis not present

## 2013-11-22 DIAGNOSIS — H353290 Exudative age-related macular degeneration, unspecified eye, stage unspecified: Secondary | ICD-10-CM | POA: Diagnosis not present

## 2013-11-22 DIAGNOSIS — H35059 Retinal neovascularization, unspecified, unspecified eye: Secondary | ICD-10-CM | POA: Diagnosis not present

## 2013-11-29 ENCOUNTER — Other Ambulatory Visit: Payer: Self-pay | Admitting: Internal Medicine

## 2013-12-04 DIAGNOSIS — L570 Actinic keratosis: Secondary | ICD-10-CM | POA: Diagnosis not present

## 2013-12-04 DIAGNOSIS — Z85820 Personal history of malignant melanoma of skin: Secondary | ICD-10-CM | POA: Diagnosis not present

## 2013-12-04 DIAGNOSIS — C4330 Malignant melanoma of unspecified part of face: Secondary | ICD-10-CM | POA: Diagnosis not present

## 2013-12-05 MED ORDER — BUDESONIDE-FORMOTEROL FUMARATE 160-4.5 MCG/ACT IN AERO: 1.0000 | INHALATION_SPRAY | Freq: Two times a day (BID) | RESPIRATORY_TRACT | Status: DC

## 2013-12-12 DIAGNOSIS — H353290 Exudative age-related macular degeneration, unspecified eye, stage unspecified: Secondary | ICD-10-CM | POA: Diagnosis not present

## 2013-12-12 DIAGNOSIS — H35359 Cystoid macular degeneration, unspecified eye: Secondary | ICD-10-CM | POA: Diagnosis not present

## 2013-12-26 DIAGNOSIS — Z23 Encounter for immunization: Secondary | ICD-10-CM | POA: Diagnosis not present

## 2014-01-01 DIAGNOSIS — R7309 Other abnormal glucose: Secondary | ICD-10-CM | POA: Diagnosis not present

## 2014-01-01 DIAGNOSIS — R609 Edema, unspecified: Secondary | ICD-10-CM | POA: Diagnosis not present

## 2014-01-01 DIAGNOSIS — R899 Unspecified abnormal finding in specimens from other organs, systems and tissues: Secondary | ICD-10-CM | POA: Diagnosis not present

## 2014-01-01 DIAGNOSIS — M549 Dorsalgia, unspecified: Secondary | ICD-10-CM | POA: Diagnosis not present

## 2014-01-01 DIAGNOSIS — R300 Dysuria: Secondary | ICD-10-CM | POA: Diagnosis not present

## 2014-01-01 DIAGNOSIS — R1084 Generalized abdominal pain: Secondary | ICD-10-CM | POA: Diagnosis not present

## 2014-01-09 DIAGNOSIS — E119 Type 2 diabetes mellitus without complications: Secondary | ICD-10-CM | POA: Diagnosis not present

## 2014-01-09 DIAGNOSIS — I10 Essential (primary) hypertension: Secondary | ICD-10-CM | POA: Diagnosis not present

## 2014-01-09 DIAGNOSIS — K580 Irritable bowel syndrome with diarrhea: Secondary | ICD-10-CM | POA: Diagnosis not present

## 2014-01-09 DIAGNOSIS — L900 Lichen sclerosus et atrophicus: Secondary | ICD-10-CM | POA: Diagnosis not present

## 2014-01-09 DIAGNOSIS — N390 Urinary tract infection, site not specified: Secondary | ICD-10-CM | POA: Diagnosis not present

## 2014-01-09 DIAGNOSIS — K219 Gastro-esophageal reflux disease without esophagitis: Secondary | ICD-10-CM | POA: Diagnosis not present

## 2014-01-09 DIAGNOSIS — J309 Allergic rhinitis, unspecified: Secondary | ICD-10-CM | POA: Diagnosis not present

## 2014-01-09 DIAGNOSIS — E785 Hyperlipidemia, unspecified: Secondary | ICD-10-CM | POA: Diagnosis not present

## 2014-01-10 ENCOUNTER — Other Ambulatory Visit: Payer: Self-pay | Admitting: Family Medicine

## 2014-01-10 DIAGNOSIS — H35052 Retinal neovascularization, unspecified, left eye: Secondary | ICD-10-CM | POA: Diagnosis not present

## 2014-01-10 DIAGNOSIS — R269 Unspecified abnormalities of gait and mobility: Secondary | ICD-10-CM

## 2014-01-10 DIAGNOSIS — H3532 Exudative age-related macular degeneration: Secondary | ICD-10-CM | POA: Diagnosis not present

## 2014-01-18 ENCOUNTER — Ambulatory Visit
Admission: RE | Admit: 2014-01-18 | Discharge: 2014-01-18 | Disposition: A | Discharge status: Home / Self Care | Payer: Medicare Other | Source: Ambulatory Visit | Attending: Family Medicine | Admitting: Family Medicine

## 2014-01-18 DIAGNOSIS — R479 Unspecified speech disturbances: Secondary | ICD-10-CM | POA: Diagnosis not present

## 2014-01-18 DIAGNOSIS — R269 Unspecified abnormalities of gait and mobility: Secondary | ICD-10-CM

## 2014-01-23 DIAGNOSIS — H35051 Retinal neovascularization, unspecified, right eye: Secondary | ICD-10-CM | POA: Diagnosis not present

## 2014-01-23 DIAGNOSIS — H3532 Exudative age-related macular degeneration: Secondary | ICD-10-CM | POA: Diagnosis not present

## 2014-01-30 DIAGNOSIS — H4011X0 Primary open-angle glaucoma, stage unspecified: Secondary | ICD-10-CM | POA: Diagnosis not present

## 2014-01-30 DIAGNOSIS — Z961 Presence of intraocular lens: Secondary | ICD-10-CM | POA: Diagnosis not present

## 2014-01-30 DIAGNOSIS — H3530 Unspecified macular degeneration: Secondary | ICD-10-CM | POA: Diagnosis not present

## 2014-02-21 DIAGNOSIS — H3532 Exudative age-related macular degeneration: Secondary | ICD-10-CM | POA: Diagnosis not present

## 2014-03-05 ENCOUNTER — Other Ambulatory Visit: Payer: Self-pay | Admitting: Dermatology

## 2014-03-05 DIAGNOSIS — Z85828 Personal history of other malignant neoplasm of skin: Secondary | ICD-10-CM | POA: Diagnosis not present

## 2014-03-05 DIAGNOSIS — D229 Melanocytic nevi, unspecified: Secondary | ICD-10-CM | POA: Diagnosis not present

## 2014-03-05 DIAGNOSIS — D0462 Carcinoma in situ of skin of left upper limb, including shoulder: Secondary | ICD-10-CM | POA: Diagnosis not present

## 2014-03-05 DIAGNOSIS — L821 Other seborrheic keratosis: Secondary | ICD-10-CM | POA: Diagnosis not present

## 2014-03-05 DIAGNOSIS — L720 Epidermal cyst: Secondary | ICD-10-CM | POA: Diagnosis not present

## 2014-03-05 DIAGNOSIS — D485 Neoplasm of uncertain behavior of skin: Secondary | ICD-10-CM | POA: Diagnosis not present

## 2014-03-05 DIAGNOSIS — Z85820 Personal history of malignant melanoma of skin: Secondary | ICD-10-CM | POA: Diagnosis not present

## 2014-03-07 DIAGNOSIS — H3532 Exudative age-related macular degeneration: Secondary | ICD-10-CM | POA: Diagnosis not present

## 2014-04-11 DIAGNOSIS — R102 Pelvic and perineal pain: Secondary | ICD-10-CM | POA: Diagnosis not present

## 2014-04-11 DIAGNOSIS — Z8673 Personal history of transient ischemic attack (TIA), and cerebral infarction without residual deficits: Secondary | ICD-10-CM | POA: Diagnosis not present

## 2014-04-11 DIAGNOSIS — L900 Lichen sclerosus et atrophicus: Secondary | ICD-10-CM | POA: Diagnosis not present

## 2014-04-11 DIAGNOSIS — E119 Type 2 diabetes mellitus without complications: Secondary | ICD-10-CM | POA: Diagnosis not present

## 2014-04-11 DIAGNOSIS — I10 Essential (primary) hypertension: Secondary | ICD-10-CM | POA: Diagnosis not present

## 2014-04-11 DIAGNOSIS — E785 Hyperlipidemia, unspecified: Secondary | ICD-10-CM | POA: Diagnosis not present

## 2014-04-15 DIAGNOSIS — H3532 Exudative age-related macular degeneration: Secondary | ICD-10-CM | POA: Diagnosis not present

## 2014-04-15 DIAGNOSIS — H35052 Retinal neovascularization, unspecified, left eye: Secondary | ICD-10-CM | POA: Diagnosis not present

## 2014-04-16 DIAGNOSIS — D0462 Carcinoma in situ of skin of left upper limb, including shoulder: Secondary | ICD-10-CM | POA: Diagnosis not present

## 2014-04-18 DIAGNOSIS — H35051 Retinal neovascularization, unspecified, right eye: Secondary | ICD-10-CM | POA: Diagnosis not present

## 2014-04-18 DIAGNOSIS — H3532 Exudative age-related macular degeneration: Secondary | ICD-10-CM | POA: Diagnosis not present

## 2014-05-20 DIAGNOSIS — H35052 Retinal neovascularization, unspecified, left eye: Secondary | ICD-10-CM | POA: Diagnosis not present

## 2014-05-20 DIAGNOSIS — H3532 Exudative age-related macular degeneration: Secondary | ICD-10-CM | POA: Diagnosis not present

## 2014-05-23 DIAGNOSIS — H35051 Retinal neovascularization, unspecified, right eye: Secondary | ICD-10-CM | POA: Diagnosis not present

## 2014-05-23 DIAGNOSIS — H3532 Exudative age-related macular degeneration: Secondary | ICD-10-CM | POA: Diagnosis not present

## 2014-05-31 DIAGNOSIS — J0100 Acute maxillary sinusitis, unspecified: Secondary | ICD-10-CM | POA: Diagnosis not present

## 2014-06-03 DIAGNOSIS — K219 Gastro-esophageal reflux disease without esophagitis: Secondary | ICD-10-CM | POA: Diagnosis not present

## 2014-06-03 DIAGNOSIS — R05 Cough: Secondary | ICD-10-CM | POA: Diagnosis not present

## 2014-06-03 DIAGNOSIS — M545 Low back pain: Secondary | ICD-10-CM | POA: Diagnosis not present

## 2014-06-27 DIAGNOSIS — H3532 Exudative age-related macular degeneration: Secondary | ICD-10-CM | POA: Diagnosis not present

## 2014-07-01 DIAGNOSIS — H35052 Retinal neovascularization, unspecified, left eye: Secondary | ICD-10-CM | POA: Diagnosis not present

## 2014-07-01 DIAGNOSIS — H3532 Exudative age-related macular degeneration: Secondary | ICD-10-CM | POA: Diagnosis not present

## 2014-07-11 DIAGNOSIS — E119 Type 2 diabetes mellitus without complications: Secondary | ICD-10-CM | POA: Diagnosis not present

## 2014-07-11 DIAGNOSIS — I10 Essential (primary) hypertension: Secondary | ICD-10-CM | POA: Diagnosis not present

## 2014-07-11 DIAGNOSIS — L900 Lichen sclerosus et atrophicus: Secondary | ICD-10-CM | POA: Diagnosis not present

## 2014-07-11 DIAGNOSIS — E785 Hyperlipidemia, unspecified: Secondary | ICD-10-CM | POA: Diagnosis not present

## 2014-07-16 DIAGNOSIS — L309 Dermatitis, unspecified: Secondary | ICD-10-CM | POA: Diagnosis not present

## 2014-07-16 DIAGNOSIS — L570 Actinic keratosis: Secondary | ICD-10-CM | POA: Diagnosis not present

## 2014-07-27 ENCOUNTER — Ambulatory Visit (INDEPENDENT_AMBULATORY_CARE_PROVIDER_SITE_OTHER): Payer: Medicare Other | Admitting: Emergency Medicine

## 2014-07-27 ENCOUNTER — Ambulatory Visit (INDEPENDENT_AMBULATORY_CARE_PROVIDER_SITE_OTHER): Payer: Medicare Other

## 2014-07-27 VITALS — BP 152/70 | HR 92 | Temp 98.5°F | Resp 19 | Ht 60.0 in | Wt 183.4 lb

## 2014-07-27 DIAGNOSIS — R059 Cough, unspecified: Secondary | ICD-10-CM

## 2014-07-27 DIAGNOSIS — J209 Acute bronchitis, unspecified: Secondary | ICD-10-CM

## 2014-07-27 DIAGNOSIS — R05 Cough: Secondary | ICD-10-CM | POA: Diagnosis not present

## 2014-07-27 DIAGNOSIS — J45909 Unspecified asthma, uncomplicated: Secondary | ICD-10-CM | POA: Diagnosis not present

## 2014-07-27 LAB — POCT CBC
Granulocyte percent: 67.7 %G (ref 37–80)
HEMATOCRIT: 40.9 % (ref 37.7–47.9)
HEMOGLOBIN: 13.5 g/dL (ref 12.2–16.2)
LYMPH, POC: 2.3 (ref 0.6–3.4)
MCH: 31 pg (ref 27–31.2)
MCHC: 32.9 g/dL (ref 31.8–35.4)
MCV: 94.2 fL (ref 80–97)
MID (cbc): 0.6 (ref 0–0.9)
MPV: 8.1 fL (ref 0–99.8)
POC Granulocyte: 6.2 (ref 2–6.9)
POC LYMPH PERCENT: 25.4 %L (ref 10–50)
POC MID %: 6.9 %M (ref 0–12)
Platelet Count, POC: 223 10*3/uL (ref 142–424)
RBC: 4.34 M/uL (ref 4.04–5.48)
RDW, POC: 15.5 %
WBC: 9.1 10*3/uL (ref 4.6–10.2)

## 2014-07-27 MED ORDER — AMOXICILLIN 500 MG PO CAPS: ORAL_CAPSULE | ORAL | Status: DC

## 2014-07-27 NOTE — Patient Instructions (Addendum)
Please increase your Symbicort and use one puff in the morning and two puffs at night until symptoms resolve.  Cough, Adult  A cough is a reflex that helps clear your throat and airways. It can help heal the body or may be a reaction to an irritated airway. A cough may only last 2 or 3 weeks (acute) or may last more than 8 weeks (chronic).  CAUSES Acute cough:  Viral or bacterial infections. Chronic cough:  Infections.  Allergies.  Asthma.  Post-nasal drip.  Smoking.  Heartburn or acid reflux.  Some medicines.  Chronic lung problems (COPD).  Cancer. SYMPTOMS   Cough.  Fever.  Chest pain.  Increased breathing rate.  High-pitched whistling sound when breathing (wheezing).  Colored mucus that you cough up (sputum). TREATMENT   A bacterial cough may be treated with antibiotic medicine.  A viral cough must run its course and will not respond to antibiotics.  Your caregiver may recommend other treatments if you have a chronic cough. HOME CARE INSTRUCTIONS   Only take over-the-counter or prescription medicines for pain, discomfort, or fever as directed by your caregiver. Use cough suppressants only as directed by your caregiver.  Use a cold steam vaporizer or humidifier in your bedroom or home to help loosen secretions.  Sleep in a semi-upright position if your cough is worse at night.  Rest as needed.  Stop smoking if you smoke. SEEK IMMEDIATE MEDICAL CARE IF:   You have pus in your sputum.  Your cough starts to worsen.  You cannot control your cough with suppressants and are losing sleep.  You begin coughing up blood.  You have difficulty breathing.  You develop pain which is getting worse or is uncontrolled with medicine.  You have a fever. MAKE SURE YOU:   Understand these instructions.  Will watch your condition.  Will get help right away if you are not doing well or get worse. Document Released: 09/04/2010 Document Revised: 05/31/2011  Document Reviewed: 09/04/2010 [facility] Patient Information 2015 [place], [place]. This information is not intended to replace advice given to you by your health care provider. Make sure you discuss any questions you have with your health care provider.

## 2014-07-27 NOTE — Progress Notes (Addendum)
   Subjective:  This chart was scribed for Connie Jordan, MD by [facility], medical scribe at [facility].The patient was seen in exam room 08 and the patient's care was started at 10:00 AM.   Patient ID: Connie Obrien, female    DOB: 1920-01-10, 81 y.o.   MRN: [id number] Chief Complaint  Patient presents with  . Cough    x 1-2 days, worsening last night, been taking mucinex, gets bronchitis easily  . Sore Throat   HPI HPI Comments: KENZEY Obrien is a 81 y.o. female who presents to [facility] complaining of a hacking cough, sore throat, and nausea. Symptoms began two days ago and worsened today. Yesterday her cough is producing a white phlegm. Slight fever today. Pt has a history of bronchitis and constantly short of breath. She denies nasal congestion and ear pain. Pt uses her Symbicort everyday and has not used he rescue inhaler in sometime.   Review of Systems  Constitutional: Positive for fever.  HENT: Negative for congestion and ear pain.   Respiratory: Positive for cough and shortness of breath.   Gastrointestinal: Positive for nausea.      Objective:  BP 152/70 mmHg  Pulse 92  Temp(Src) 98.5 F (36.9 C) (Oral)  Resp 19  Ht 5' (1.524 m)  Wt 183 lb 6.4 oz (83.19 kg)  BMI 35.82 kg/m2  SpO2 93% Physical Exam  Nursing note and vitals reviewed. CONSTITUTIONAL: Well developed/well nourished, alert and cooperative HEAD: Normocephalic/atraumatic EYES: EOMI/PERRL ENMT: Mucous membranes moist, some nasal congetsion NECK: supple no meningeal signs SPINE/BACK:entire spine nontender CV: S1/S2 noted, no rubs/gallops noted, grade two systolic murmur left sternal border. LUNGS: Lungs are clear to auscultation bilaterally, no apparent distress ABDOMEN: soft, nontender, no rebound or guarding, bowel sounds noted throughout abdomen GU:no cva tenderness NEURO: Pt is awake/alert/appropriate, moves all extremitiesx4.  No facial droop.     EXTREMITIES: pulses normal/equal, full ROM SKIN: warm, color normal PSYCH: no abnormalities of mood noted, alert and oriented to situation    Results for orders placed or performed in visit on 07/27/14  POCT CBC  Result Value Ref Range   WBC 9.1 4.6 - 10.2 K/uL   Lymph, poc 2.3 0.6 - 3.4   POC LYMPH PERCENT 25.4 10 - 50 %L   MID (cbc) 0.6 0 - 0.9   POC MID % 6.9 0 - 12 %M   POC Granulocyte 6.2 2 - 6.9   Granulocyte percent 67.7 37 - 80 %G   RBC 4.34 4.04 - 5.48 M/uL   Hemoglobin 13.5 12.2 - 16.2 g/dL   HCT, POC 40.9 37.7 - 47.9 %   MCV 94.2 80 - 97 fL   MCH, POC 31.0 27 - 31.2 pg   MCHC 32.9 31.8 - 35.4 g/dL   RDW, POC 15.5 %   Platelet Count, POC 223 142 - 424 K/uL   MPV 8.1 0 - 99.8 fL  [facility] reading (PRIMARY) by  Dr.Elvyn Krohn there is a large left diaphragmatic hernia. There are no acute infiltrates.   Assessment & Plan:  She will continue her Symbicort but increase to 2 puffs at night instead of 1. She will take Mucinex for cough.I personally performed the services described in this documentation, which was scribed in my presence. The recorded information has been reviewed and is accurate. Radiologist did not see an infiltrate. Her white count is normal and she was afebrile  Connie Jordan, MD

## 2014-08-05 DIAGNOSIS — H04121 Dry eye syndrome of right lacrimal gland: Secondary | ICD-10-CM | POA: Diagnosis not present

## 2014-08-05 DIAGNOSIS — H3530 Unspecified macular degeneration: Secondary | ICD-10-CM | POA: Diagnosis not present

## 2014-08-05 DIAGNOSIS — Z961 Presence of intraocular lens: Secondary | ICD-10-CM | POA: Diagnosis not present

## 2014-08-05 DIAGNOSIS — H4011X0 Primary open-angle glaucoma, stage unspecified: Secondary | ICD-10-CM | POA: Diagnosis not present

## 2014-08-12 DIAGNOSIS — H3532 Exudative age-related macular degeneration: Secondary | ICD-10-CM | POA: Diagnosis not present

## 2014-08-22 DIAGNOSIS — H3532 Exudative age-related macular degeneration: Secondary | ICD-10-CM | POA: Diagnosis not present

## 2014-08-22 DIAGNOSIS — H35351 Cystoid macular degeneration, right eye: Secondary | ICD-10-CM | POA: Diagnosis not present

## 2014-08-22 DIAGNOSIS — H35051 Retinal neovascularization, unspecified, right eye: Secondary | ICD-10-CM | POA: Diagnosis not present

## 2014-08-27 DIAGNOSIS — R140 Abdominal distension (gaseous): Secondary | ICD-10-CM | POA: Diagnosis not present

## 2014-08-27 DIAGNOSIS — K5730 Diverticulosis of large intestine without perforation or abscess without bleeding: Secondary | ICD-10-CM | POA: Diagnosis not present

## 2014-08-27 DIAGNOSIS — E669 Obesity, unspecified: Secondary | ICD-10-CM | POA: Diagnosis not present

## 2014-08-27 DIAGNOSIS — K219 Gastro-esophageal reflux disease without esophagitis: Secondary | ICD-10-CM | POA: Diagnosis not present

## 2014-08-30 ENCOUNTER — Other Ambulatory Visit: Payer: Self-pay | Admitting: Internal Medicine

## 2014-09-04 DIAGNOSIS — H43393 Other vitreous opacities, bilateral: Secondary | ICD-10-CM | POA: Diagnosis not present

## 2014-09-04 DIAGNOSIS — H40033 Anatomical narrow angle, bilateral: Secondary | ICD-10-CM | POA: Diagnosis not present

## 2014-09-05 DIAGNOSIS — H04123 Dry eye syndrome of bilateral lacrimal glands: Secondary | ICD-10-CM | POA: Diagnosis not present

## 2014-09-10 DIAGNOSIS — H35052 Retinal neovascularization, unspecified, left eye: Secondary | ICD-10-CM | POA: Diagnosis not present

## 2014-09-10 DIAGNOSIS — H3532 Exudative age-related macular degeneration: Secondary | ICD-10-CM | POA: Diagnosis not present

## 2014-10-03 DIAGNOSIS — H3532 Exudative age-related macular degeneration: Secondary | ICD-10-CM | POA: Diagnosis not present

## 2014-10-15 DIAGNOSIS — E119 Type 2 diabetes mellitus without complications: Secondary | ICD-10-CM | POA: Diagnosis not present

## 2014-10-15 DIAGNOSIS — I10 Essential (primary) hypertension: Secondary | ICD-10-CM | POA: Diagnosis not present

## 2014-10-15 DIAGNOSIS — E785 Hyperlipidemia, unspecified: Secondary | ICD-10-CM | POA: Diagnosis not present

## 2014-10-15 DIAGNOSIS — L900 Lichen sclerosus et atrophicus: Secondary | ICD-10-CM | POA: Diagnosis not present

## 2014-10-22 DIAGNOSIS — H3532 Exudative age-related macular degeneration: Secondary | ICD-10-CM | POA: Diagnosis not present

## 2014-10-24 ENCOUNTER — Other Ambulatory Visit: Payer: Self-pay | Admitting: Nurse Practitioner

## 2014-10-24 DIAGNOSIS — R300 Dysuria: Secondary | ICD-10-CM | POA: Diagnosis not present

## 2014-10-24 DIAGNOSIS — N904 Leukoplakia of vulva: Secondary | ICD-10-CM | POA: Diagnosis not present

## 2014-10-24 DIAGNOSIS — N762 Acute vulvitis: Secondary | ICD-10-CM | POA: Diagnosis not present

## 2014-10-30 DIAGNOSIS — L309 Dermatitis, unspecified: Secondary | ICD-10-CM | POA: Diagnosis not present

## 2014-10-30 DIAGNOSIS — Z85828 Personal history of other malignant neoplasm of skin: Secondary | ICD-10-CM | POA: Diagnosis not present

## 2014-10-30 DIAGNOSIS — Z85820 Personal history of malignant melanoma of skin: Secondary | ICD-10-CM | POA: Diagnosis not present

## 2014-10-30 DIAGNOSIS — L570 Actinic keratosis: Secondary | ICD-10-CM | POA: Diagnosis not present

## 2014-10-30 DIAGNOSIS — D225 Melanocytic nevi of trunk: Secondary | ICD-10-CM | POA: Diagnosis not present

## 2014-10-30 DIAGNOSIS — D485 Neoplasm of uncertain behavior of skin: Secondary | ICD-10-CM | POA: Diagnosis not present

## 2014-10-31 DIAGNOSIS — D2339 Other benign neoplasm of skin of other parts of face: Secondary | ICD-10-CM | POA: Diagnosis not present

## 2014-11-01 DIAGNOSIS — H4011X0 Primary open-angle glaucoma, stage unspecified: Secondary | ICD-10-CM | POA: Diagnosis not present

## 2014-11-07 DIAGNOSIS — H3532 Exudative age-related macular degeneration: Secondary | ICD-10-CM | POA: Diagnosis not present

## 2014-11-14 DIAGNOSIS — R32 Unspecified urinary incontinence: Secondary | ICD-10-CM | POA: Diagnosis not present

## 2014-11-14 DIAGNOSIS — L900 Lichen sclerosus et atrophicus: Secondary | ICD-10-CM | POA: Diagnosis not present

## 2014-11-27 DIAGNOSIS — H3532 Exudative age-related macular degeneration: Secondary | ICD-10-CM | POA: Diagnosis not present

## 2014-12-09 ENCOUNTER — Emergency Department (HOSPITAL_BASED_OUTPATIENT_CLINIC_OR_DEPARTMENT_OTHER): Payer: Medicare Other

## 2014-12-09 ENCOUNTER — Encounter (HOSPITAL_BASED_OUTPATIENT_CLINIC_OR_DEPARTMENT_OTHER): Payer: Self-pay

## 2014-12-09 ENCOUNTER — Emergency Department (HOSPITAL_BASED_OUTPATIENT_CLINIC_OR_DEPARTMENT_OTHER)
Admission: EM | Admit: 2014-12-09 | Discharge: 2014-12-09 | Disposition: A | Discharge status: Home / Self Care | Payer: Medicare Other | Attending: Emergency Medicine | Admitting: Emergency Medicine

## 2014-12-09 DIAGNOSIS — E669 Obesity, unspecified: Secondary | ICD-10-CM | POA: Insufficient documentation

## 2014-12-09 DIAGNOSIS — Z7982 Long term (current) use of aspirin: Secondary | ICD-10-CM | POA: Insufficient documentation

## 2014-12-09 DIAGNOSIS — M545 Low back pain, unspecified: Secondary | ICD-10-CM

## 2014-12-09 DIAGNOSIS — M8580 Other specified disorders of bone density and structure, unspecified site: Secondary | ICD-10-CM | POA: Insufficient documentation

## 2014-12-09 DIAGNOSIS — M546 Pain in thoracic spine: Secondary | ICD-10-CM | POA: Diagnosis not present

## 2014-12-09 DIAGNOSIS — Z7951 Long term (current) use of inhaled steroids: Secondary | ICD-10-CM | POA: Diagnosis not present

## 2014-12-09 DIAGNOSIS — Z8719 Personal history of other diseases of the digestive system: Secondary | ICD-10-CM | POA: Diagnosis not present

## 2014-12-09 DIAGNOSIS — I10 Essential (primary) hypertension: Secondary | ICD-10-CM | POA: Diagnosis not present

## 2014-12-09 DIAGNOSIS — Z79899 Other long term (current) drug therapy: Secondary | ICD-10-CM | POA: Insufficient documentation

## 2014-12-09 DIAGNOSIS — H409 Unspecified glaucoma: Secondary | ICD-10-CM | POA: Diagnosis not present

## 2014-12-09 DIAGNOSIS — M419 Scoliosis, unspecified: Secondary | ICD-10-CM | POA: Diagnosis not present

## 2014-12-09 DIAGNOSIS — J45909 Unspecified asthma, uncomplicated: Secondary | ICD-10-CM | POA: Insufficient documentation

## 2014-12-09 DIAGNOSIS — M1990 Unspecified osteoarthritis, unspecified site: Secondary | ICD-10-CM | POA: Insufficient documentation

## 2014-12-09 DIAGNOSIS — H9190 Unspecified hearing loss, unspecified ear: Secondary | ICD-10-CM | POA: Diagnosis not present

## 2014-12-09 MED ORDER — TRAMADOL HCL 50 MG PO TABS: 50.0000 mg | ORAL_TABLET | Freq: Four times a day (QID) | ORAL | Status: DC | PRN

## 2014-12-09 NOTE — ED Provider Notes (Signed)
CSN: [id number]     Arrival date & time 12/09/14  1207 History   First MD Initiated Contact with Patient 12/09/14 1215     Chief Complaint  Patient presents with  . Back Pain     (Consider location/radiation/quality/duration/timing/severity/associated sxs/prior Treatment) Patient is a 81 y.o. female presenting with back pain. The history is provided by the patient.  Back Pain Associated symptoms: no abdominal pain, no chest pain, no dysuria, no headaches, no numbness and no weakness    patient has had pain in her lower back for the last 3 days. Dull. Worse with movements. She is normally rather active. No numbness or weakness. No dysuria. No fevers or chills. No fall or trauma. She's had some mild back pain at times whenever pains like this. No abdominal pain.  Past Medical History  Diagnosis Date  . Obesity   . Osteoarthritis   . Scoliosis   . Glaucoma   . Deaf   . Allergic rhinitis   . Diverticulosis   . Asthma   . HTN (hypertension)   . Osteopenia   . GERD (gastroesophageal reflux disease)   . IBS (irritable bowel syndrome)    Past Surgical History  Procedure Laterality Date  . Total abdominal hysterectomy    . Abdominal hernia repair    . Appendectomy    . Colon resection    . Cataract extraction    . Hemorrhoid surgery    . Breast biopsy    . Nissen fundoplication     Family History  Problem Relation Age of Onset  . Coronary artery disease Father   . Diabetes Father   . Coronary artery disease Mother   . Coronary artery disease Brother   . Prostate cancer Brother     x2   Social History  Substance Use Topics  . Smoking status: Never Smoker   . Smokeless tobacco: Never Used  . Alcohol Use: No   OB History    No data available     Review of Systems  Constitutional: Negative for activity change and appetite change.  Respiratory: Negative for shortness of breath.   Cardiovascular: Negative for chest pain.  Gastrointestinal: Negative for abdominal pain.   Genitourinary: Negative for dysuria and flank pain.  Musculoskeletal: Positive for back pain. Negative for neck stiffness.  Skin: Negative for rash.  Neurological: Negative for weakness, numbness and headaches.  Psychiatric/Behavioral: Negative for behavioral problems.      Allergies  Septra and Sulfa antibiotics  Home Medications   Prior to Admission medications   Medication Sig Start Date End Date Taking? Authorizing Provider  aspirin 325 MG EC tablet Take 325 mg by mouth daily.    Historical Provider, MD  bimatoprost (LUMIGAN) 0.03 % ophthalmic solution Place 1 drop into both eyes at bedtime.      Historical Provider, MD  calcium citrate-vitamin D (CITRACAL+D) 315-200 MG-UNIT per tablet Take 1 tablet by mouth 2 (two) times daily.     Historical Provider, MD  cetirizine (ZYRTEC) 10 MG tablet Take 10 mg by mouth daily.      Historical Provider, MD  ciprofloxacin (CIPRO) 250 MG tablet Take 1 tablet by mouth 2 (two) times daily.    Historical Provider, MD  cycloSPORINE (RESTASIS) 0.05 % ophthalmic emulsion Place 1 drop into both eyes 2 (two) times daily.    Historical Provider, MD  dorzolamide-timolol (COSOPT) 22.3-6.8 MG/ML ophthalmic solution Place 1 drop into both eyes 2 (two) times daily.    Historical Provider, MD  estradiol (  ESTRACE) 0.1 MG/GM vaginal cream Place 2 g vaginally as needed.     Historical Provider, MD  fluticasone (FLONASE) 50 MCG/ACT nasal spray Place 2 sprays into the nose daily. 05/31/11 09/10/13  Brand Males, MD  furosemide (LASIX) 40 MG tablet Take 40 mg by mouth Daily.  04/21/11   Historical Provider, MD  KLOR-CON M20 20 MEQ tablet Take 20 mEq by mouth daily.  03/12/11   Historical Provider, MD  losartan (COZAAR) 100 MG tablet Take 100 mg by mouth daily.  08/07/11   Historical Provider, MD  Nutritional Supplements (PRO-FLORA PLUS) CAPS Take 1 capsule by mouth daily. 10/02/10   Tammy S Parrett, NP  Polyvinyl Alcohol-Povidone (REFRESH OP) Place 1 drop into both  eyes daily as needed (dry eyes).    Historical Provider, MD  pravastatin (PRAVACHOL) 40 MG tablet Take 1 tablet (40 mg total) by mouth at bedtime. 10/02/10   Tammy S Parrett, NP  SYMBICORT 160-4.5 MCG/ACT inhaler INHALE ONE PUFF INTO LUNGS TWICE DAILY 08/30/14   Brand Males, MD  traMADol (ULTRAM) 50 MG tablet Take 1 tablet (50 mg total) by mouth every 6 (six) hours as needed. 12/09/14   Davonna Belling, MD  VENTOLIN HFA 108 (90 BASE) MCG/ACT inhaler Inhale 1 puff into the lungs Every 6 hours as needed for wheezing or shortness of breath.  04/26/11   Historical Provider, MD  vitamin B-12 (CYANOCOBALAMIN) 500 MCG tablet Take 500 mcg by mouth daily.    Historical Provider, MD  vitamin D, CHOLECALCIFEROL, 400 UNITS tablet Take 400 Units by mouth daily.      Historical Provider, MD  vitamin E 400 UNIT capsule Take 400 Units by mouth daily.      Historical Provider, MD   BP 130/61 mmHg  Pulse 83  Temp(Src) 97.7 F (36.5 C) (Oral)  Resp 20  Ht 5' (1.524 m)  Wt 173 lb (78.472 kg)  BMI 33.79 kg/m2  SpO2 96% Physical Exam  Constitutional: She appears well-developed.  HENT:  Head: Atraumatic.  Abdominal: There is no tenderness.  Musculoskeletal: She exhibits tenderness.  Or lumbar tenderness. Sensation grossly intact over both feet. Good straight leg raise bilaterally.  Neurological: She is alert.  Skin: Skin is warm.    ED Course  Procedures (including critical care time) Labs Review Labs Reviewed - No data to display  Imaging Review Dg Lumbar Spine Complete  12/09/2014   CLINICAL DATA:  Low back pain which started yesterday. Difficultly walking. No known injury.  EXAM: LUMBAR SPINE - COMPLETE 4+ VIEW  COMPARISON:  None.  FINDINGS: Mild L4-5 and severe bilateral L5-S1 facet arthropathy. Mild left sacroiliitis. Grade 1 anterior listhesis of L5 on the sacrum due to degenerative change. Otherwise normal anterior-posterior alignment. Mild degenerative disc disease at L2-3 and L3-4.  Calcification of the aorta.  IMPRESSION: Degenerative changes   Electronically Signed   By: Skipper Cliche M.D.   On: 12/09/2014 12:57   I have personally reviewed and evaluated these images and lab results as part of my medical decision-making.   EKG Interpretation None      MDM   Final diagnoses:  Midline low back pain without sciatica    Patient with back pain. X-ray shows chronic changes. Will follow with PCP as needed. Doubt aortic disease as the cause. Doubt UTI as the cause.    Davonna Belling, MD 12/09/14 [phone]

## 2014-12-09 NOTE — Discharge Instructions (Signed)
Back Pain, Adult Low back pain is very common. About 1 in 5 people have back pain.The cause of low back pain is rarely dangerous. The pain often gets better over time.About half of people with a sudden onset of back pain feel better in just 2 weeks. About 8 in 10 people feel better by 6 weeks.  CAUSES Some common causes of back pain include:  Strain of the muscles or ligaments supporting the spine.  Wear and tear (degeneration) of the spinal discs.  Arthritis.  Direct injury to the back. DIAGNOSIS Most of the time, the direct cause of low back pain is not known.However, back pain can be treated effectively even when the exact cause of the pain is unknown.Answering your caregiver's questions about your overall health and symptoms is one of the most accurate ways to make sure the cause of your pain is not dangerous. If your caregiver needs more information, he or she may order lab work or imaging tests (X-rays or MRIs).However, even if imaging tests show changes in your back, this usually does not require surgery. HOME CARE INSTRUCTIONS For many people, back pain returns.Since low back pain is rarely dangerous, it is often a condition that people can learn to manageon their own.   Remain active. It is stressful on the back to sit or stand in one place. Do not sit, drive, or stand in one place for more than 30 minutes at a time. Take short walks on level surfaces as soon as pain allows.Try to increase the length of time you walk each day.  Do not stay in bed.Resting more than 1 or 2 days can delay your recovery.  Do not avoid exercise or work.Your body is made to move.It is not dangerous to be active, even though your back may hurt.Your back will likely heal faster if you return to being active before your pain is gone.  Pay attention to your body when you bend and lift. Many people have less discomfortwhen lifting if they bend their knees, keep the load close to their bodies,and  avoid twisting. Often, the most comfortable positions are those that put less stress on your recovering back.  Find a comfortable position to sleep. Use a firm mattress and lie on your side with your knees slightly bent. If you lie on your back, put a pillow under your knees.  Only take over-the-counter or prescription medicines as directed by your caregiver. Over-the-counter medicines to reduce pain and inflammation are often the most helpful.Your caregiver may prescribe muscle relaxant drugs.These medicines help dull your pain so you can more quickly return to your normal activities and healthy exercise.  Put ice on the injured area.  Put ice in a plastic bag.  Place a towel between your skin and the bag.  Leave the ice on for 15-20 minutes, 03-04 times a day for the first 2 to 3 days. After that, ice and heat may be alternated to reduce pain and spasms.  Ask your caregiver about trying back exercises and gentle massage. This may be of some benefit.  Avoid feeling anxious or stressed.Stress increases muscle tension and can worsen back pain.It is important to recognize when you are anxious or stressed and learn ways to manage it.Exercise is a great option. SEEK MEDICAL CARE IF:  You have pain that is not relieved with rest or medicine.  You have pain that does not improve in 1 week.  You have new symptoms.  You are generally not feeling well. SEEK   IMMEDIATE MEDICAL CARE IF:   You have pain that radiates from your back into your legs.  You develop new bowel or bladder control problems.  You have unusual weakness or numbness in your arms or legs.  You develop nausea or vomiting.  You develop abdominal pain.  You feel faint. Document Released: 03/08/2005 Document Revised: 09/07/2011 Document Reviewed: 07/10/2013 [employer] Patient Information 2015 [employer]. This information is not intended to replace advice given to you by your health care provider. Make sure you  discuss any questions you have with your health care provider.  

## 2014-12-09 NOTE — ED Notes (Signed)
C/o lower back pain x 2-3 days-denies injury-pt presents in w/c-states she normally uses cane

## 2014-12-10 DIAGNOSIS — H3532 Exudative age-related macular degeneration: Secondary | ICD-10-CM | POA: Diagnosis not present

## 2014-12-11 DIAGNOSIS — Z1389 Encounter for screening for other disorder: Secondary | ICD-10-CM | POA: Diagnosis not present

## 2014-12-11 DIAGNOSIS — M545 Low back pain: Secondary | ICD-10-CM | POA: Diagnosis not present

## 2014-12-18 DIAGNOSIS — M545 Low back pain: Secondary | ICD-10-CM | POA: Diagnosis not present

## 2014-12-18 DIAGNOSIS — Z791 Long term (current) use of non-steroidal anti-inflammatories (NSAID): Secondary | ICD-10-CM | POA: Diagnosis not present

## 2014-12-18 DIAGNOSIS — R5383 Other fatigue: Secondary | ICD-10-CM | POA: Diagnosis not present

## 2014-12-18 DIAGNOSIS — Z23 Encounter for immunization: Secondary | ICD-10-CM | POA: Diagnosis not present

## 2014-12-20 DIAGNOSIS — H4011X0 Primary open-angle glaucoma, stage unspecified: Secondary | ICD-10-CM | POA: Diagnosis not present

## 2015-01-01 DIAGNOSIS — H353221 Exudative age-related macular degeneration, left eye, with active choroidal neovascularization: Secondary | ICD-10-CM | POA: Diagnosis not present

## 2015-01-14 DIAGNOSIS — H353211 Exudative age-related macular degeneration, right eye, with active choroidal neovascularization: Secondary | ICD-10-CM | POA: Diagnosis not present

## 2015-01-14 DIAGNOSIS — H4010X3 Unspecified open-angle glaucoma, severe stage: Secondary | ICD-10-CM | POA: Diagnosis not present

## 2015-01-14 DIAGNOSIS — H4051X3 Glaucoma secondary to other eye disorders, right eye, severe stage: Secondary | ICD-10-CM | POA: Diagnosis not present

## 2015-02-05 DIAGNOSIS — H353221 Exudative age-related macular degeneration, left eye, with active choroidal neovascularization: Secondary | ICD-10-CM | POA: Diagnosis not present

## 2015-02-19 DIAGNOSIS — H353211 Exudative age-related macular degeneration, right eye, with active choroidal neovascularization: Secondary | ICD-10-CM | POA: Diagnosis not present

## 2015-02-20 DIAGNOSIS — E119 Type 2 diabetes mellitus without complications: Secondary | ICD-10-CM | POA: Diagnosis not present

## 2015-02-20 DIAGNOSIS — J4540 Moderate persistent asthma, uncomplicated: Secondary | ICD-10-CM | POA: Diagnosis not present

## 2015-02-20 DIAGNOSIS — E8809 Other disorders of plasma-protein metabolism, not elsewhere classified: Secondary | ICD-10-CM | POA: Diagnosis not present

## 2015-02-20 DIAGNOSIS — L900 Lichen sclerosus et atrophicus: Secondary | ICD-10-CM | POA: Diagnosis not present

## 2015-02-20 DIAGNOSIS — I10 Essential (primary) hypertension: Secondary | ICD-10-CM | POA: Diagnosis not present

## 2015-02-20 DIAGNOSIS — E785 Hyperlipidemia, unspecified: Secondary | ICD-10-CM | POA: Diagnosis not present

## 2015-02-28 DIAGNOSIS — H04121 Dry eye syndrome of right lacrimal gland: Secondary | ICD-10-CM | POA: Diagnosis not present

## 2015-02-28 DIAGNOSIS — H1859 Other hereditary corneal dystrophies: Secondary | ICD-10-CM | POA: Diagnosis not present

## 2015-02-28 DIAGNOSIS — H401130 Primary open-angle glaucoma, bilateral, stage unspecified: Secondary | ICD-10-CM | POA: Diagnosis not present

## 2015-02-28 DIAGNOSIS — Z961 Presence of intraocular lens: Secondary | ICD-10-CM | POA: Diagnosis not present

## 2015-03-12 DIAGNOSIS — H353221 Exudative age-related macular degeneration, left eye, with active choroidal neovascularization: Secondary | ICD-10-CM | POA: Diagnosis not present

## 2015-03-12 DIAGNOSIS — H353211 Exudative age-related macular degeneration, right eye, with active choroidal neovascularization: Secondary | ICD-10-CM | POA: Diagnosis not present

## 2015-03-12 DIAGNOSIS — H353134 Nonexudative age-related macular degeneration, bilateral, advanced atrophic with subfoveal involvement: Secondary | ICD-10-CM | POA: Diagnosis not present

## 2015-03-25 DIAGNOSIS — H353211 Exudative age-related macular degeneration, right eye, with active choroidal neovascularization: Secondary | ICD-10-CM | POA: Diagnosis not present

## 2015-03-25 DIAGNOSIS — H353221 Exudative age-related macular degeneration, left eye, with active choroidal neovascularization: Secondary | ICD-10-CM | POA: Diagnosis not present

## 2015-03-25 DIAGNOSIS — H353134 Nonexudative age-related macular degeneration, bilateral, advanced atrophic with subfoveal involvement: Secondary | ICD-10-CM | POA: Diagnosis not present

## 2015-03-28 DIAGNOSIS — H401110 Primary open-angle glaucoma, right eye, stage unspecified: Secondary | ICD-10-CM | POA: Diagnosis not present

## 2015-04-16 DIAGNOSIS — H353134 Nonexudative age-related macular degeneration, bilateral, advanced atrophic with subfoveal involvement: Secondary | ICD-10-CM | POA: Diagnosis not present

## 2015-04-16 DIAGNOSIS — H353211 Exudative age-related macular degeneration, right eye, with active choroidal neovascularization: Secondary | ICD-10-CM | POA: Diagnosis not present

## 2015-04-16 DIAGNOSIS — H353221 Exudative age-related macular degeneration, left eye, with active choroidal neovascularization: Secondary | ICD-10-CM | POA: Diagnosis not present

## 2015-04-25 DIAGNOSIS — H401120 Primary open-angle glaucoma, left eye, stage unspecified: Secondary | ICD-10-CM | POA: Diagnosis not present

## 2015-04-29 DIAGNOSIS — H353211 Exudative age-related macular degeneration, right eye, with active choroidal neovascularization: Secondary | ICD-10-CM | POA: Diagnosis not present

## 2015-05-08 DIAGNOSIS — D225 Melanocytic nevi of trunk: Secondary | ICD-10-CM | POA: Diagnosis not present

## 2015-05-08 DIAGNOSIS — L570 Actinic keratosis: Secondary | ICD-10-CM | POA: Diagnosis not present

## 2015-05-08 DIAGNOSIS — L821 Other seborrheic keratosis: Secondary | ICD-10-CM | POA: Diagnosis not present

## 2015-05-08 DIAGNOSIS — Z85828 Personal history of other malignant neoplasm of skin: Secondary | ICD-10-CM | POA: Diagnosis not present

## 2015-05-08 DIAGNOSIS — Z87898 Personal history of other specified conditions: Secondary | ICD-10-CM | POA: Diagnosis not present

## 2015-05-08 DIAGNOSIS — Z23 Encounter for immunization: Secondary | ICD-10-CM | POA: Diagnosis not present

## 2015-05-28 DIAGNOSIS — H353221 Exudative age-related macular degeneration, left eye, with active choroidal neovascularization: Secondary | ICD-10-CM | POA: Diagnosis not present

## 2015-05-28 DIAGNOSIS — H353134 Nonexudative age-related macular degeneration, bilateral, advanced atrophic with subfoveal involvement: Secondary | ICD-10-CM | POA: Diagnosis not present

## 2015-05-28 DIAGNOSIS — H353211 Exudative age-related macular degeneration, right eye, with active choroidal neovascularization: Secondary | ICD-10-CM | POA: Diagnosis not present

## 2015-06-03 DIAGNOSIS — H353211 Exudative age-related macular degeneration, right eye, with active choroidal neovascularization: Secondary | ICD-10-CM | POA: Diagnosis not present

## 2015-06-23 DIAGNOSIS — L900 Lichen sclerosus et atrophicus: Secondary | ICD-10-CM | POA: Diagnosis not present

## 2015-06-23 DIAGNOSIS — I10 Essential (primary) hypertension: Secondary | ICD-10-CM | POA: Diagnosis not present

## 2015-06-23 DIAGNOSIS — E785 Hyperlipidemia, unspecified: Secondary | ICD-10-CM | POA: Diagnosis not present

## 2015-06-23 DIAGNOSIS — E119 Type 2 diabetes mellitus without complications: Secondary | ICD-10-CM | POA: Diagnosis not present

## 2015-06-26 DIAGNOSIS — N9089 Other specified noninflammatory disorders of vulva and perineum: Secondary | ICD-10-CM | POA: Diagnosis not present

## 2015-07-02 DIAGNOSIS — H353221 Exudative age-related macular degeneration, left eye, with active choroidal neovascularization: Secondary | ICD-10-CM | POA: Diagnosis not present

## 2015-07-09 DIAGNOSIS — J45909 Unspecified asthma, uncomplicated: Secondary | ICD-10-CM | POA: Diagnosis not present

## 2015-07-22 DIAGNOSIS — H353211 Exudative age-related macular degeneration, right eye, with active choroidal neovascularization: Secondary | ICD-10-CM | POA: Diagnosis not present

## 2015-08-08 DIAGNOSIS — H353221 Exudative age-related macular degeneration, left eye, with active choroidal neovascularization: Secondary | ICD-10-CM | POA: Diagnosis not present

## 2015-08-08 DIAGNOSIS — H353211 Exudative age-related macular degeneration, right eye, with active choroidal neovascularization: Secondary | ICD-10-CM | POA: Diagnosis not present

## 2015-08-08 DIAGNOSIS — H353134 Nonexudative age-related macular degeneration, bilateral, advanced atrophic with subfoveal involvement: Secondary | ICD-10-CM | POA: Diagnosis not present

## 2015-08-08 DIAGNOSIS — H35352 Cystoid macular degeneration, left eye: Secondary | ICD-10-CM | POA: Diagnosis not present

## 2015-09-03 DIAGNOSIS — H40033 Anatomical narrow angle, bilateral: Secondary | ICD-10-CM | POA: Diagnosis not present

## 2015-09-03 DIAGNOSIS — H04123 Dry eye syndrome of bilateral lacrimal glands: Secondary | ICD-10-CM | POA: Diagnosis not present

## 2015-09-03 DIAGNOSIS — B350 Tinea barbae and tinea capitis: Secondary | ICD-10-CM | POA: Diagnosis not present

## 2015-09-09 DIAGNOSIS — H35351 Cystoid macular degeneration, right eye: Secondary | ICD-10-CM | POA: Diagnosis not present

## 2015-09-09 DIAGNOSIS — L219 Seborrheic dermatitis, unspecified: Secondary | ICD-10-CM | POA: Diagnosis not present

## 2015-09-09 DIAGNOSIS — H353134 Nonexudative age-related macular degeneration, bilateral, advanced atrophic with subfoveal involvement: Secondary | ICD-10-CM | POA: Diagnosis not present

## 2015-09-09 DIAGNOSIS — H353221 Exudative age-related macular degeneration, left eye, with active choroidal neovascularization: Secondary | ICD-10-CM | POA: Diagnosis not present

## 2015-09-09 DIAGNOSIS — L570 Actinic keratosis: Secondary | ICD-10-CM | POA: Diagnosis not present

## 2015-09-09 DIAGNOSIS — H353211 Exudative age-related macular degeneration, right eye, with active choroidal neovascularization: Secondary | ICD-10-CM | POA: Diagnosis not present

## 2015-09-18 DIAGNOSIS — H353221 Exudative age-related macular degeneration, left eye, with active choroidal neovascularization: Secondary | ICD-10-CM | POA: Diagnosis not present

## 2015-10-07 DIAGNOSIS — L282 Other prurigo: Secondary | ICD-10-CM | POA: Diagnosis not present

## 2015-10-07 DIAGNOSIS — L905 Scar conditions and fibrosis of skin: Secondary | ICD-10-CM | POA: Diagnosis not present

## 2015-10-08 IMAGING — CR DG CHEST 2V
2 series · 2 of 2 positions shown · non-contrast
Comparison: None.

CLINICAL DATA: Chronic asthma.

EXAM:
CHEST  2 VIEW

[PA]
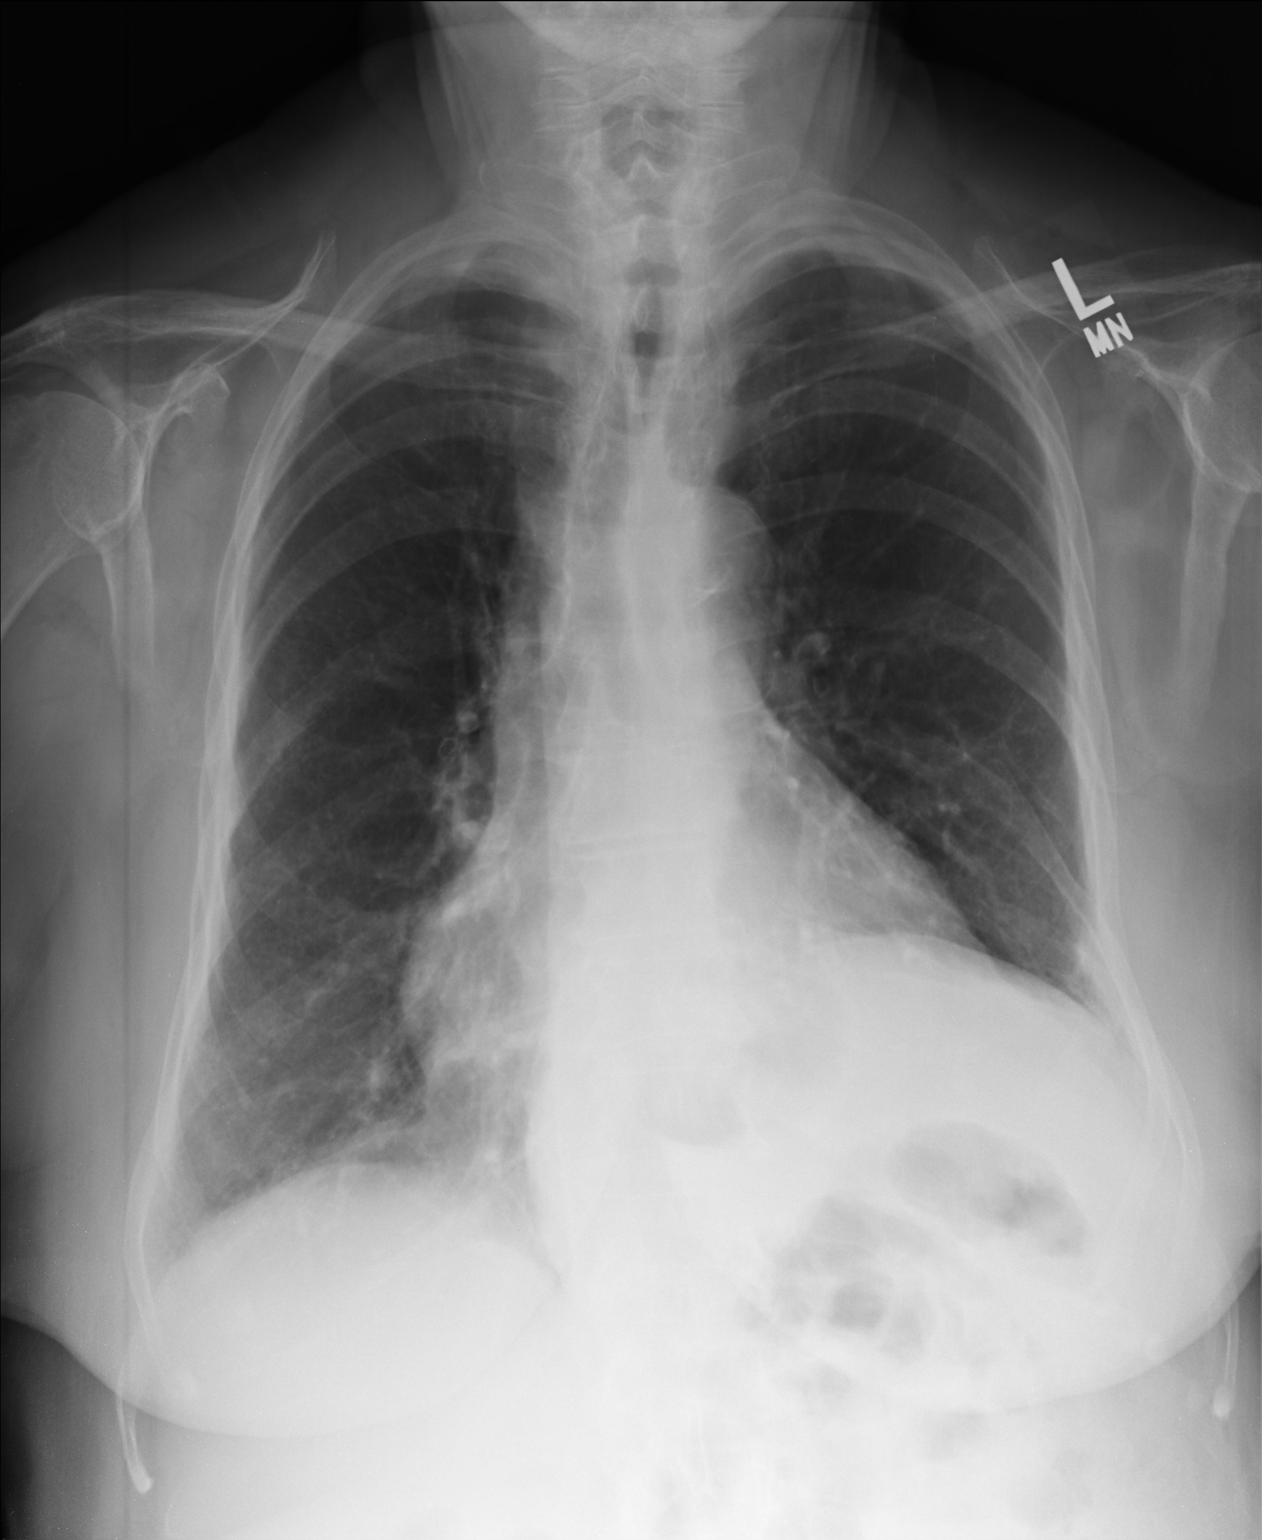

[lateral]
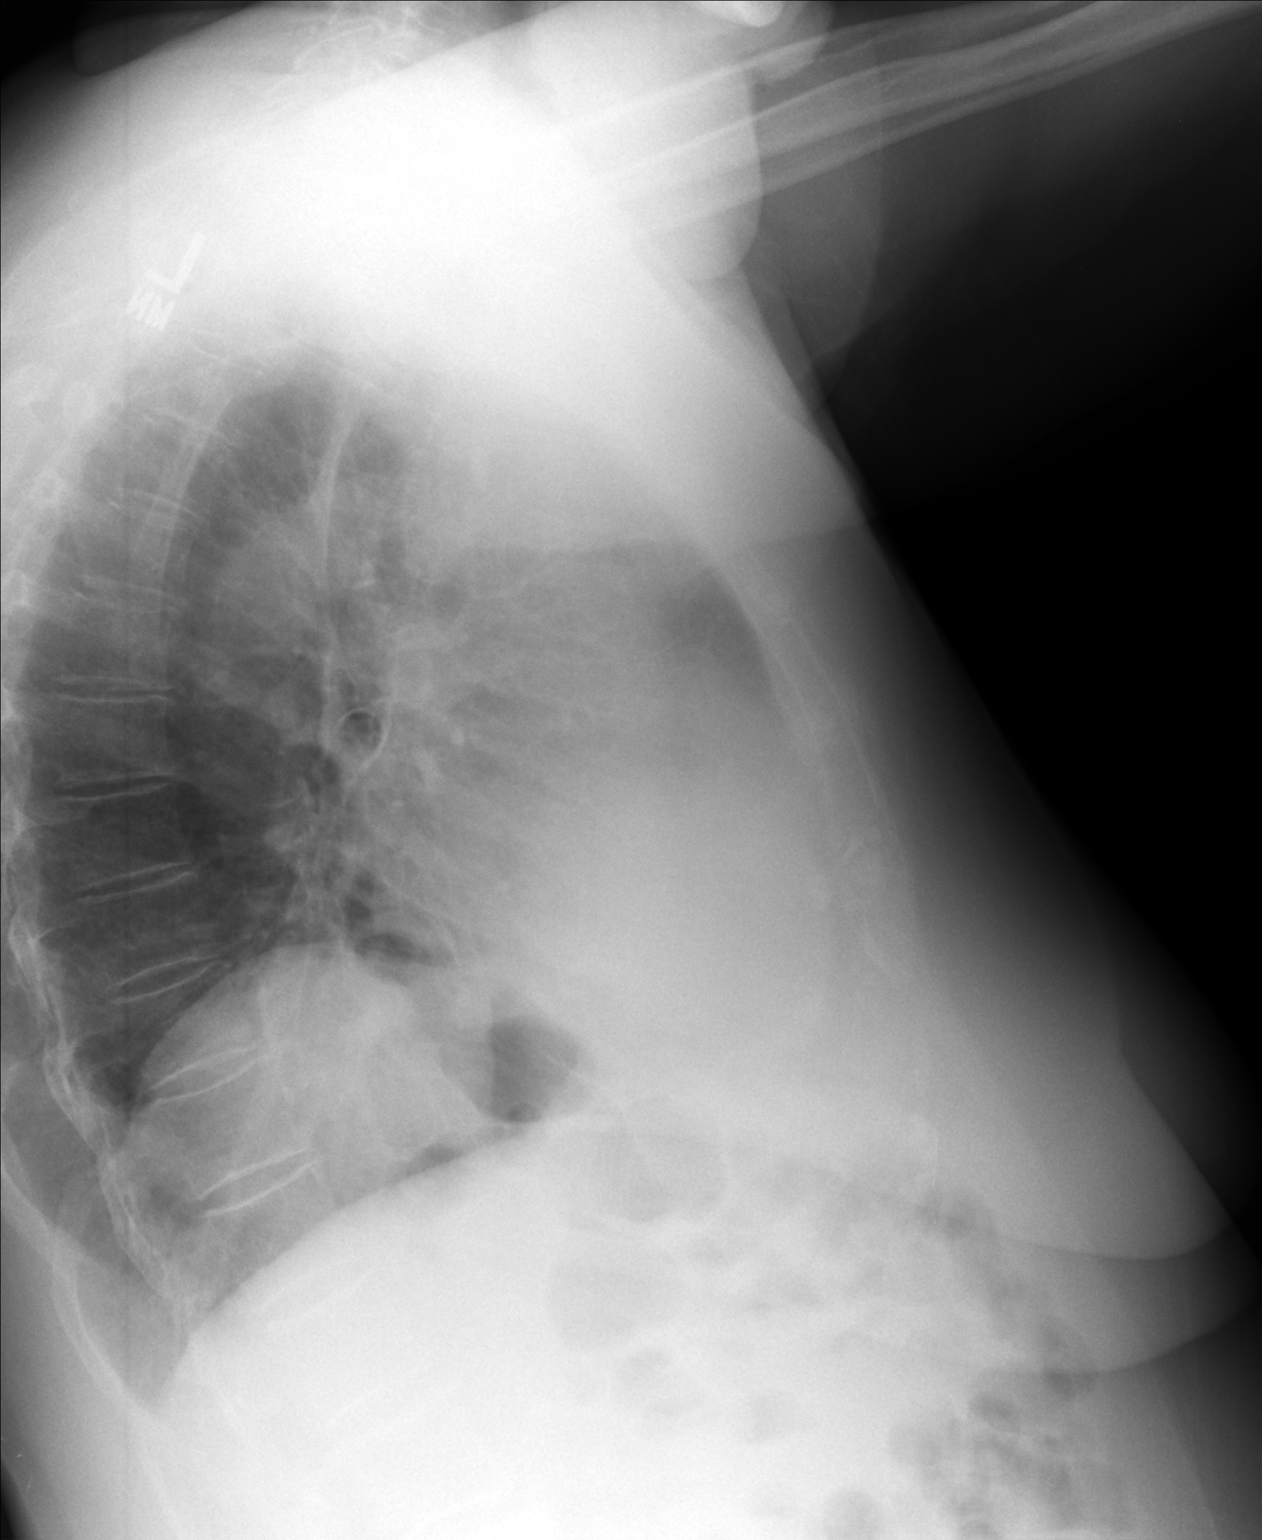

[2 of 2 positions shown; findings below may reference images not displayed]

FINDINGS: Cardiac silhouette is mildly enlarged. There is evidence of a small
to moderate hiatal hernia. No other mediastinal mass or abnormality.
No hilar masses or evidence of adenopathy.

Elevated left hemidiaphragm. No lung consolidation or edema. Lungs
are hyperexpanded. No pleural effusion or pneumothorax.

Bony thorax is demineralized but grossly intact.
IMPRESSION: No acute cardiopulmonary disease.

## 2015-10-10 DIAGNOSIS — H401133 Primary open-angle glaucoma, bilateral, severe stage: Secondary | ICD-10-CM | POA: Diagnosis not present

## 2015-10-10 DIAGNOSIS — H401130 Primary open-angle glaucoma, bilateral, stage unspecified: Secondary | ICD-10-CM | POA: Diagnosis not present

## 2015-11-04 DIAGNOSIS — H353221 Exudative age-related macular degeneration, left eye, with active choroidal neovascularization: Secondary | ICD-10-CM | POA: Diagnosis not present

## 2015-11-06 DIAGNOSIS — D225 Melanocytic nevi of trunk: Secondary | ICD-10-CM | POA: Diagnosis not present

## 2015-11-06 DIAGNOSIS — Z86008 Personal history of in-situ neoplasm of other site: Secondary | ICD-10-CM | POA: Diagnosis not present

## 2015-11-06 DIAGNOSIS — Z85828 Personal history of other malignant neoplasm of skin: Secondary | ICD-10-CM | POA: Diagnosis not present

## 2015-11-06 DIAGNOSIS — L280 Lichen simplex chronicus: Secondary | ICD-10-CM | POA: Diagnosis not present

## 2015-11-18 DIAGNOSIS — H353211 Exudative age-related macular degeneration, right eye, with active choroidal neovascularization: Secondary | ICD-10-CM | POA: Diagnosis not present

## 2015-12-12 DIAGNOSIS — Z23 Encounter for immunization: Secondary | ICD-10-CM | POA: Diagnosis not present

## 2015-12-23 DIAGNOSIS — H353221 Exudative age-related macular degeneration, left eye, with active choroidal neovascularization: Secondary | ICD-10-CM | POA: Diagnosis not present

## 2015-12-25 DIAGNOSIS — H401130 Primary open-angle glaucoma, bilateral, stage unspecified: Secondary | ICD-10-CM | POA: Diagnosis not present

## 2015-12-25 DIAGNOSIS — H11821 Conjunctivochalasis, right eye: Secondary | ICD-10-CM | POA: Diagnosis not present

## 2015-12-25 DIAGNOSIS — H1859 Other hereditary corneal dystrophies: Secondary | ICD-10-CM | POA: Diagnosis not present

## 2015-12-25 DIAGNOSIS — H353230 Exudative age-related macular degeneration, bilateral, stage unspecified: Secondary | ICD-10-CM | POA: Diagnosis not present

## 2015-12-31 DIAGNOSIS — H353221 Exudative age-related macular degeneration, left eye, with active choroidal neovascularization: Secondary | ICD-10-CM | POA: Diagnosis not present

## 2015-12-31 DIAGNOSIS — H353211 Exudative age-related macular degeneration, right eye, with active choroidal neovascularization: Secondary | ICD-10-CM | POA: Diagnosis not present

## 2015-12-31 DIAGNOSIS — H353134 Nonexudative age-related macular degeneration, bilateral, advanced atrophic with subfoveal involvement: Secondary | ICD-10-CM | POA: Diagnosis not present

## 2015-12-31 DIAGNOSIS — H35351 Cystoid macular degeneration, right eye: Secondary | ICD-10-CM | POA: Diagnosis not present

## 2016-01-02 DIAGNOSIS — L900 Lichen sclerosus et atrophicus: Secondary | ICD-10-CM | POA: Diagnosis not present

## 2016-01-02 DIAGNOSIS — E785 Hyperlipidemia, unspecified: Secondary | ICD-10-CM | POA: Diagnosis not present

## 2016-01-02 DIAGNOSIS — I10 Essential (primary) hypertension: Secondary | ICD-10-CM | POA: Diagnosis not present

## 2016-01-02 DIAGNOSIS — E119 Type 2 diabetes mellitus without complications: Secondary | ICD-10-CM | POA: Diagnosis not present

## 2016-01-02 DIAGNOSIS — G459 Transient cerebral ischemic attack, unspecified: Secondary | ICD-10-CM | POA: Diagnosis not present

## 2016-01-08 DIAGNOSIS — L280 Lichen simplex chronicus: Secondary | ICD-10-CM | POA: Diagnosis not present

## 2016-01-14 DIAGNOSIS — L900 Lichen sclerosus et atrophicus: Secondary | ICD-10-CM | POA: Diagnosis not present

## 2016-01-27 DIAGNOSIS — H353221 Exudative age-related macular degeneration, left eye, with active choroidal neovascularization: Secondary | ICD-10-CM | POA: Diagnosis not present

## 2016-02-02 DIAGNOSIS — G458 Other transient cerebral ischemic attacks and related syndromes: Secondary | ICD-10-CM | POA: Diagnosis not present

## 2016-02-18 DIAGNOSIS — H353211 Exudative age-related macular degeneration, right eye, with active choroidal neovascularization: Secondary | ICD-10-CM | POA: Diagnosis not present

## 2016-02-18 DIAGNOSIS — H353134 Nonexudative age-related macular degeneration, bilateral, advanced atrophic with subfoveal involvement: Secondary | ICD-10-CM | POA: Diagnosis not present

## 2016-02-18 DIAGNOSIS — H353221 Exudative age-related macular degeneration, left eye, with active choroidal neovascularization: Secondary | ICD-10-CM | POA: Diagnosis not present

## 2016-03-02 DIAGNOSIS — H353221 Exudative age-related macular degeneration, left eye, with active choroidal neovascularization: Secondary | ICD-10-CM | POA: Diagnosis not present

## 2016-03-04 DIAGNOSIS — L280 Lichen simplex chronicus: Secondary | ICD-10-CM | POA: Diagnosis not present

## 2016-03-04 DIAGNOSIS — L570 Actinic keratosis: Secondary | ICD-10-CM | POA: Diagnosis not present

## 2016-03-04 DIAGNOSIS — D485 Neoplasm of uncertain behavior of skin: Secondary | ICD-10-CM | POA: Diagnosis not present

## 2016-04-10 DIAGNOSIS — J45901 Unspecified asthma with (acute) exacerbation: Secondary | ICD-10-CM | POA: Diagnosis not present

## 2016-04-10 DIAGNOSIS — J209 Acute bronchitis, unspecified: Secondary | ICD-10-CM | POA: Diagnosis not present

## 2016-04-10 DIAGNOSIS — R05 Cough: Secondary | ICD-10-CM | POA: Diagnosis not present

## 2016-04-10 DIAGNOSIS — J069 Acute upper respiratory infection, unspecified: Secondary | ICD-10-CM | POA: Diagnosis not present

## 2016-04-13 DIAGNOSIS — H353221 Exudative age-related macular degeneration, left eye, with active choroidal neovascularization: Secondary | ICD-10-CM | POA: Diagnosis not present

## 2016-04-13 DIAGNOSIS — H4032X3 Glaucoma secondary to eye trauma, left eye, severe stage: Secondary | ICD-10-CM | POA: Diagnosis not present

## 2016-04-21 DIAGNOSIS — H353221 Exudative age-related macular degeneration, left eye, with active choroidal neovascularization: Secondary | ICD-10-CM | POA: Diagnosis not present

## 2016-04-21 DIAGNOSIS — H353211 Exudative age-related macular degeneration, right eye, with active choroidal neovascularization: Secondary | ICD-10-CM | POA: Diagnosis not present

## 2016-04-27 DIAGNOSIS — L989 Disorder of the skin and subcutaneous tissue, unspecified: Secondary | ICD-10-CM | POA: Diagnosis not present

## 2016-04-27 DIAGNOSIS — J4540 Moderate persistent asthma, uncomplicated: Secondary | ICD-10-CM | POA: Diagnosis not present

## 2016-04-27 DIAGNOSIS — I10 Essential (primary) hypertension: Secondary | ICD-10-CM | POA: Diagnosis not present

## 2016-04-27 DIAGNOSIS — E785 Hyperlipidemia, unspecified: Secondary | ICD-10-CM | POA: Diagnosis not present

## 2016-04-27 DIAGNOSIS — E119 Type 2 diabetes mellitus without complications: Secondary | ICD-10-CM | POA: Diagnosis not present

## 2016-04-27 DIAGNOSIS — K219 Gastro-esophageal reflux disease without esophagitis: Secondary | ICD-10-CM | POA: Diagnosis not present

## 2016-04-27 DIAGNOSIS — L900 Lichen sclerosus et atrophicus: Secondary | ICD-10-CM | POA: Diagnosis not present

## 2016-04-29 DIAGNOSIS — H401131 Primary open-angle glaucoma, bilateral, mild stage: Secondary | ICD-10-CM | POA: Diagnosis not present

## 2016-04-29 DIAGNOSIS — H04123 Dry eye syndrome of bilateral lacrimal glands: Secondary | ICD-10-CM | POA: Diagnosis not present

## 2016-04-29 DIAGNOSIS — H353231 Exudative age-related macular degeneration, bilateral, with active choroidal neovascularization: Secondary | ICD-10-CM | POA: Diagnosis not present

## 2016-04-29 DIAGNOSIS — H11821 Conjunctivochalasis, right eye: Secondary | ICD-10-CM | POA: Diagnosis not present

## 2016-05-27 DIAGNOSIS — H353134 Nonexudative age-related macular degeneration, bilateral, advanced atrophic with subfoveal involvement: Secondary | ICD-10-CM | POA: Diagnosis not present

## 2016-05-27 DIAGNOSIS — H4051X3 Glaucoma secondary to other eye disorders, right eye, severe stage: Secondary | ICD-10-CM | POA: Diagnosis not present

## 2016-05-27 DIAGNOSIS — H353221 Exudative age-related macular degeneration, left eye, with active choroidal neovascularization: Secondary | ICD-10-CM | POA: Diagnosis not present

## 2016-05-27 DIAGNOSIS — H353211 Exudative age-related macular degeneration, right eye, with active choroidal neovascularization: Secondary | ICD-10-CM | POA: Diagnosis not present

## 2016-06-16 DIAGNOSIS — H4051X3 Glaucoma secondary to other eye disorders, right eye, severe stage: Secondary | ICD-10-CM | POA: Diagnosis not present

## 2016-06-16 DIAGNOSIS — H353211 Exudative age-related macular degeneration, right eye, with active choroidal neovascularization: Secondary | ICD-10-CM | POA: Diagnosis not present

## 2016-07-05 DIAGNOSIS — H26491 Other secondary cataract, right eye: Secondary | ICD-10-CM | POA: Diagnosis not present

## 2016-07-05 DIAGNOSIS — H353221 Exudative age-related macular degeneration, left eye, with active choroidal neovascularization: Secondary | ICD-10-CM | POA: Diagnosis not present

## 2016-07-05 DIAGNOSIS — H353211 Exudative age-related macular degeneration, right eye, with active choroidal neovascularization: Secondary | ICD-10-CM | POA: Diagnosis not present

## 2016-07-05 DIAGNOSIS — H43811 Vitreous degeneration, right eye: Secondary | ICD-10-CM | POA: Diagnosis not present

## 2016-07-05 DIAGNOSIS — H353132 Nonexudative age-related macular degeneration, bilateral, intermediate dry stage: Secondary | ICD-10-CM | POA: Diagnosis not present

## 2016-07-26 DIAGNOSIS — E119 Type 2 diabetes mellitus without complications: Secondary | ICD-10-CM | POA: Diagnosis not present

## 2016-07-26 DIAGNOSIS — R413 Other amnesia: Secondary | ICD-10-CM | POA: Diagnosis not present

## 2016-07-26 DIAGNOSIS — N390 Urinary tract infection, site not specified: Secondary | ICD-10-CM | POA: Diagnosis not present

## 2016-07-26 DIAGNOSIS — E785 Hyperlipidemia, unspecified: Secondary | ICD-10-CM | POA: Diagnosis not present

## 2016-07-26 DIAGNOSIS — R410 Disorientation, unspecified: Secondary | ICD-10-CM | POA: Diagnosis not present

## 2016-07-26 DIAGNOSIS — I10 Essential (primary) hypertension: Secondary | ICD-10-CM | POA: Diagnosis not present

## 2016-08-06 DIAGNOSIS — R300 Dysuria: Secondary | ICD-10-CM | POA: Diagnosis not present

## 2016-08-06 DIAGNOSIS — R5383 Other fatigue: Secondary | ICD-10-CM | POA: Diagnosis not present

## 2016-08-06 DIAGNOSIS — K219 Gastro-esophageal reflux disease without esophagitis: Secondary | ICD-10-CM | POA: Diagnosis not present

## 2016-08-06 DIAGNOSIS — R630 Anorexia: Secondary | ICD-10-CM | POA: Diagnosis not present

## 2016-08-11 DIAGNOSIS — H353211 Exudative age-related macular degeneration, right eye, with active choroidal neovascularization: Secondary | ICD-10-CM | POA: Diagnosis not present

## 2016-08-11 DIAGNOSIS — H353134 Nonexudative age-related macular degeneration, bilateral, advanced atrophic with subfoveal involvement: Secondary | ICD-10-CM | POA: Diagnosis not present

## 2016-08-11 DIAGNOSIS — H18891 Other specified disorders of cornea, right eye: Secondary | ICD-10-CM | POA: Diagnosis not present

## 2016-08-11 DIAGNOSIS — H18892 Other specified disorders of cornea, left eye: Secondary | ICD-10-CM | POA: Diagnosis not present

## 2016-08-30 DIAGNOSIS — H01025 Squamous blepharitis left lower eyelid: Secondary | ICD-10-CM | POA: Diagnosis not present

## 2016-08-30 DIAGNOSIS — H16223 Keratoconjunctivitis sicca, not specified as Sjogren's, bilateral: Secondary | ICD-10-CM | POA: Diagnosis not present

## 2016-08-30 DIAGNOSIS — H01021 Squamous blepharitis right upper eyelid: Secondary | ICD-10-CM | POA: Diagnosis not present

## 2016-08-30 DIAGNOSIS — H01024 Squamous blepharitis left upper eyelid: Secondary | ICD-10-CM | POA: Diagnosis not present

## 2016-08-30 DIAGNOSIS — Z961 Presence of intraocular lens: Secondary | ICD-10-CM | POA: Diagnosis not present

## 2016-08-30 DIAGNOSIS — H01022 Squamous blepharitis right lower eyelid: Secondary | ICD-10-CM | POA: Diagnosis not present

## 2016-08-30 DIAGNOSIS — H1859 Other hereditary corneal dystrophies: Secondary | ICD-10-CM | POA: Diagnosis not present

## 2016-09-08 DIAGNOSIS — H353211 Exudative age-related macular degeneration, right eye, with active choroidal neovascularization: Secondary | ICD-10-CM | POA: Diagnosis not present

## 2016-09-08 DIAGNOSIS — H35352 Cystoid macular degeneration, left eye: Secondary | ICD-10-CM | POA: Diagnosis not present

## 2016-09-08 DIAGNOSIS — H18891 Other specified disorders of cornea, right eye: Secondary | ICD-10-CM | POA: Diagnosis not present

## 2016-09-08 DIAGNOSIS — H35351 Cystoid macular degeneration, right eye: Secondary | ICD-10-CM | POA: Diagnosis not present

## 2016-09-08 DIAGNOSIS — H353221 Exudative age-related macular degeneration, left eye, with active choroidal neovascularization: Secondary | ICD-10-CM | POA: Diagnosis not present

## 2016-09-10 DIAGNOSIS — L900 Lichen sclerosus et atrophicus: Secondary | ICD-10-CM | POA: Diagnosis not present

## 2016-09-29 DIAGNOSIS — R300 Dysuria: Secondary | ICD-10-CM | POA: Diagnosis not present

## 2016-09-29 DIAGNOSIS — R600 Localized edema: Secondary | ICD-10-CM | POA: Diagnosis not present

## 2016-09-29 DIAGNOSIS — N390 Urinary tract infection, site not specified: Secondary | ICD-10-CM | POA: Diagnosis not present

## 2016-09-29 DIAGNOSIS — D229 Melanocytic nevi, unspecified: Secondary | ICD-10-CM | POA: Diagnosis not present

## 2016-09-29 DIAGNOSIS — L821 Other seborrheic keratosis: Secondary | ICD-10-CM | POA: Diagnosis not present

## 2016-09-29 DIAGNOSIS — Z1389 Encounter for screening for other disorder: Secondary | ICD-10-CM | POA: Diagnosis not present

## 2016-10-25 DIAGNOSIS — H16223 Keratoconjunctivitis sicca, not specified as Sjogren's, bilateral: Secondary | ICD-10-CM | POA: Diagnosis not present

## 2016-10-25 DIAGNOSIS — Z961 Presence of intraocular lens: Secondary | ICD-10-CM | POA: Diagnosis not present

## 2016-10-25 DIAGNOSIS — H1859 Other hereditary corneal dystrophies: Secondary | ICD-10-CM | POA: Diagnosis not present

## 2016-10-25 DIAGNOSIS — H01021 Squamous blepharitis right upper eyelid: Secondary | ICD-10-CM | POA: Diagnosis not present

## 2016-10-25 DIAGNOSIS — H01025 Squamous blepharitis left lower eyelid: Secondary | ICD-10-CM | POA: Diagnosis not present

## 2016-10-25 DIAGNOSIS — H01022 Squamous blepharitis right lower eyelid: Secondary | ICD-10-CM | POA: Diagnosis not present

## 2016-10-25 DIAGNOSIS — H01024 Squamous blepharitis left upper eyelid: Secondary | ICD-10-CM | POA: Diagnosis not present

## 2016-10-27 DIAGNOSIS — H353221 Exudative age-related macular degeneration, left eye, with active choroidal neovascularization: Secondary | ICD-10-CM | POA: Diagnosis not present

## 2016-10-27 DIAGNOSIS — H35352 Cystoid macular degeneration, left eye: Secondary | ICD-10-CM | POA: Diagnosis not present

## 2016-10-27 DIAGNOSIS — H353211 Exudative age-related macular degeneration, right eye, with active choroidal neovascularization: Secondary | ICD-10-CM | POA: Diagnosis not present

## 2016-11-05 ENCOUNTER — Other Ambulatory Visit: Payer: Self-pay | Admitting: Family Medicine

## 2016-11-05 ENCOUNTER — Ambulatory Visit
Admission: RE | Admit: 2016-11-05 | Discharge: 2016-11-05 | Disposition: A | Discharge status: Home / Self Care | Payer: Medicare Other | Source: Ambulatory Visit | Attending: Family Medicine | Admitting: Family Medicine

## 2016-11-05 DIAGNOSIS — S20222A Contusion of left back wall of thorax, initial encounter: Secondary | ICD-10-CM

## 2016-11-05 DIAGNOSIS — M25552 Pain in left hip: Secondary | ICD-10-CM

## 2016-11-05 DIAGNOSIS — M549 Dorsalgia, unspecified: Secondary | ICD-10-CM | POA: Diagnosis not present

## 2016-11-05 DIAGNOSIS — S3992XA Unspecified injury of lower back, initial encounter: Secondary | ICD-10-CM | POA: Diagnosis not present

## 2016-11-05 DIAGNOSIS — I10 Essential (primary) hypertension: Secondary | ICD-10-CM | POA: Diagnosis not present

## 2016-11-05 DIAGNOSIS — S79912A Unspecified injury of left hip, initial encounter: Secondary | ICD-10-CM | POA: Diagnosis not present

## 2016-11-05 DIAGNOSIS — W19XXXA Unspecified fall, initial encounter: Secondary | ICD-10-CM | POA: Diagnosis not present

## 2016-11-05 DIAGNOSIS — E785 Hyperlipidemia, unspecified: Secondary | ICD-10-CM | POA: Diagnosis not present

## 2016-11-05 DIAGNOSIS — R441 Visual hallucinations: Secondary | ICD-10-CM | POA: Diagnosis not present

## 2016-11-05 DIAGNOSIS — E119 Type 2 diabetes mellitus without complications: Secondary | ICD-10-CM | POA: Diagnosis not present

## 2016-11-12 ENCOUNTER — Encounter: Payer: Self-pay | Admitting: Neurology

## 2016-12-01 DIAGNOSIS — H18891 Other specified disorders of cornea, right eye: Secondary | ICD-10-CM | POA: Diagnosis not present

## 2016-12-01 DIAGNOSIS — H353221 Exudative age-related macular degeneration, left eye, with active choroidal neovascularization: Secondary | ICD-10-CM | POA: Diagnosis not present

## 2016-12-01 DIAGNOSIS — H353211 Exudative age-related macular degeneration, right eye, with active choroidal neovascularization: Secondary | ICD-10-CM | POA: Diagnosis not present

## 2016-12-01 DIAGNOSIS — H35352 Cystoid macular degeneration, left eye: Secondary | ICD-10-CM | POA: Diagnosis not present

## 2016-12-29 DIAGNOSIS — H16223 Keratoconjunctivitis sicca, not specified as Sjogren's, bilateral: Secondary | ICD-10-CM | POA: Diagnosis not present

## 2016-12-29 DIAGNOSIS — H353130 Nonexudative age-related macular degeneration, bilateral, stage unspecified: Secondary | ICD-10-CM | POA: Diagnosis not present

## 2016-12-29 DIAGNOSIS — H01022 Squamous blepharitis right lower eyelid: Secondary | ICD-10-CM | POA: Diagnosis not present

## 2016-12-29 DIAGNOSIS — H01025 Squamous blepharitis left lower eyelid: Secondary | ICD-10-CM | POA: Diagnosis not present

## 2016-12-29 DIAGNOSIS — H01024 Squamous blepharitis left upper eyelid: Secondary | ICD-10-CM | POA: Diagnosis not present

## 2016-12-29 DIAGNOSIS — H401133 Primary open-angle glaucoma, bilateral, severe stage: Secondary | ICD-10-CM | POA: Diagnosis not present

## 2016-12-29 DIAGNOSIS — H1859 Other hereditary corneal dystrophies: Secondary | ICD-10-CM | POA: Diagnosis not present

## 2016-12-29 DIAGNOSIS — H01021 Squamous blepharitis right upper eyelid: Secondary | ICD-10-CM | POA: Diagnosis not present

## 2016-12-31 ENCOUNTER — Encounter: Payer: Self-pay | Admitting: Neurology

## 2016-12-31 ENCOUNTER — Ambulatory Visit (INDEPENDENT_AMBULATORY_CARE_PROVIDER_SITE_OTHER): Payer: Medicare Other | Admitting: Neurology

## 2016-12-31 VITALS — BP 132/66 | HR 89 | Ht 60.0 in | Wt 178.0 lb

## 2016-12-31 DIAGNOSIS — R441 Visual hallucinations: Secondary | ICD-10-CM | POA: Insufficient documentation

## 2016-12-31 DIAGNOSIS — G3184 Mild cognitive impairment, so stated: Secondary | ICD-10-CM | POA: Diagnosis not present

## 2016-12-31 MED ORDER — OLANZAPINE 2.5 MG PO TABS: 2.5000 mg | ORAL_TABLET | Freq: Every day | ORAL | 6 refills | Status: DC

## 2016-12-31 NOTE — Progress Notes (Signed)
NEUROLOGY CONSULTATION NOTE  Connie Obrien MRN: [id number] DOB: [birth year]  Referring provider: Dr. Harlan Stains Primary care provider: Dr. Harlan Stains  Reason for consult:  Visual hallucinations  Dear Dr Dema Severin:  Thank you for your kind referral of [name] for consultation of the above symptoms. Although her history is well known to you, please allow me to reiterate it for the purpose of our medical record. The patient was accompanied to the clinic by her daughter who also provides collateral information. Records and images were personally reviewed where available.  HISTORY OF PRESENT ILLNESS: This is a pleasant 83 year old right-handed woman with a history of hypertension, hyperlipidemia, macular degeneration and glaucoma, presenting for evaluation of visual hallucinations. 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 She and her daughter report that visual hallucinations started a year or so ago. The patient reports they mostly occur in the middle of the night, she has seen a man or 3 little girls at the foot of her bed. She tried to speak to him but he did not speak back, then she thought they must not be there. She states it is not every night, but her daughter reports it occurs almost every night. What is most bothersome for her is she sees bugs all the time, on her bed or bed sheet. She feels those are real, she thinks having the exterminator come will help with it. Her daughter reports she would see the bugs even when sitting on her recliner. The hallucinations seem to get worse after 6pm. No auditory hallucinations. No personality changes. She feels her memory is very good for her age. Her family started noticing memory changes around 2 years ago, progressing over the years. She could not remember what she ate an hour earlier. She repeats herself constantly. Family took over her finances and started managing her medications a year ago because she  could not see them anymore. They deny missing bills or medications prior to this. She stopped driving at age 49, denied getting lost driving. Family rotates to stay with her on a daily basis, she cannot fix her meals or use the stove and needs help with getting into the shower. She needs help choosing clothes, but can put them on by feeling them.   She has occasional mild frontal headaches, occurring more frequently recently per daughter. She has occasional dizziness when standing. She has no light perception on the left eye and can see shapes on the right eye. No dysarthria/dysphagia, bowel/bladder dysfunction, anosmia, or tremors. She has neck and back pain, more after a fall a couple of months ago. She has occasional numbness and tingling in her hands and feet.    PAST MEDICAL HISTORY: Past Medical History:  Diagnosis Date  . Allergic rhinitis   . Asthma   . Deaf   . Diverticulosis   . GERD (gastroesophageal reflux disease)   . Glaucoma   . HTN (hypertension)   . IBS (irritable bowel syndrome)   . Obesity   . Osteoarthritis   . Osteopenia   . Scoliosis     PAST SURGICAL HISTORY: Past Surgical History:  Procedure Laterality Date  . ABDOMINAL HERNIA REPAIR    . APPENDECTOMY    . BREAST BIOPSY    . CATARACT EXTRACTION    . COLON RESECTION    . HEMORRHOID SURGERY    . NISSEN FUNDOPLICATION    . TOTAL ABDOMINAL HYSTERECTOMY  MEDICATIONS: Current Outpatient Prescriptions on File Prior to Visit  Medication Sig Dispense Refill  . aspirin 325 MG EC tablet Take 325 mg by mouth daily.    . bimatoprost (LUMIGAN) 0.03 % ophthalmic solution Place 1 drop into both eyes at bedtime.      . calcium citrate-vitamin D (CITRACAL+D) 315-200 MG-UNIT per tablet Take 1 tablet by mouth 2 (two) times daily.     . cetirizine (ZYRTEC) 10 MG tablet Take 10 mg by mouth daily.      . ciprofloxacin (CIPRO) 250 MG tablet Take 1 tablet by mouth 2 (two) times daily.    . cycloSPORINE (RESTASIS) 0.05 %  ophthalmic emulsion Place 1 drop into both eyes 2 (two) times daily.    . dorzolamide-timolol (COSOPT) 22.3-6.8 MG/ML ophthalmic solution Place 1 drop into both eyes 2 (two) times daily.    Marland Kitchen estradiol (ESTRACE) 0.1 MG/GM vaginal cream Place 2 g vaginally as needed.     . fluticasone (FLONASE) 50 MCG/ACT nasal spray Place 2 sprays into the nose daily. 16 g 6  . furosemide (LASIX) 40 MG tablet Take 40 mg by mouth Daily.     Marland Kitchen KLOR-CON M20 20 MEQ tablet Take 20 mEq by mouth daily.     Marland Kitchen losartan (COZAAR) 100 MG tablet Take 100 mg by mouth daily.     . Nutritional Supplements (PRO-FLORA PLUS) CAPS Take 1 capsule by mouth daily.    . Polyvinyl Alcohol-Povidone (REFRESH OP) Place 1 drop into both eyes daily as needed (dry eyes).    . pravastatin (PRAVACHOL) 40 MG tablet Take 1 tablet (40 mg total) by mouth at bedtime.    . SYMBICORT 160-4.5 MCG/ACT inhaler INHALE ONE PUFF INTO LUNGS TWICE DAILY 1 Inhaler 2  . traMADol (ULTRAM) 50 MG tablet Take 1 tablet (50 mg total) by mouth every 6 (six) hours as needed. 8 tablet 0  . VENTOLIN HFA 108 (90 BASE) MCG/ACT inhaler Inhale 1 puff into the lungs Every 6 hours as needed for wheezing or shortness of breath.     . vitamin B-12 (CYANOCOBALAMIN) 500 MCG tablet Take 500 mcg by mouth daily.    . vitamin D, CHOLECALCIFEROL, 400 UNITS tablet Take 400 Units by mouth daily.      . vitamin E 400 UNIT capsule Take 400 Units by mouth daily.      . [DISCONTINUED] beclomethasone (QVAR) 80 MCG/ACT inhaler Inhale 2 puffs into the lungs 2 (two) times daily. 1 Inhaler 5   No current facility-administered medications on file prior to visit.     ALLERGIES: Allergies  Allergen Reactions  . Septra [Bactrim]   . Sulfa Antibiotics     FAMILY HISTORY: Family History  Problem Relation Age of Onset  . Coronary artery disease Father   . Diabetes Father   . Coronary artery disease Mother   . Coronary artery disease Brother   . Prostate cancer Brother        x2     SOCIAL HISTORY: Social History   Social History  . Marital status: Widowed    Spouse name: N/A  . Number of children: 8  . Years of education: N/A   Occupational History  . retired from [employer] History Main Topics  . Smoking status: Never Smoker  . Smokeless tobacco: Never Used  . Alcohol use No  . Drug use: No  . Sexual activity: Not on file   Other Topics Concern  . Not on file   Social  History Narrative  . No narrative on file    REVIEW OF SYSTEMS: Constitutional: No fevers, chills, or sweats, no generalized fatigue, change in appetite Eyes: No visual changes, double vision, eye pain Ear, nose and throat: No hearing loss, ear pain, nasal congestion, sore throat Cardiovascular: No chest pain, palpitations Respiratory:  No shortness of breath at rest or with exertion, wheezes GastrointestinaI: No nausea, vomiting, diarrhea, abdominal pain, fecal incontinence Genitourinary:  No dysuria, urinary retention or frequency Musculoskeletal:  + neck pain, back pain Integumentary: No rash, pruritus, skin lesions Neurological: as above Psychiatric: No depression, insomnia, anxiety Endocrine: No palpitations, fatigue, diaphoresis, mood swings, change in appetite, change in weight, increased thirst Hematologic/Lymphatic:  No anemia, purpura, petechiae. Allergic/Immunologic: no itchy/runny eyes, nasal congestion, recent allergic reactions, rashes  PHYSICAL EXAM: Vitals:   12/31/16 1353  BP: 132/66  Pulse: 89  SpO2: 92%   General: No acute distress Head:  Normocephalic/atraumatic Eyes: Fundoscopic exam shows bilateral sharp discs, no vessel changes, exudates, or hemorrhages Neck: supple, no paraspinal tenderness, full range of motion Back: No paraspinal tenderness Heart: regular rate and rhythm Lungs: Clear to auscultation bilaterally. Vascular: No carotid bruits. Skin/Extremities: No rash, no edema Neurological Exam: Mental status: alert and  oriented to person, place, and month/day/season. States year is 2017, no dysarthria or aphasia, Fund of knowledge is appropriate.  Recent and remote memory are impaired.  Attention and concentration are reduced. She is able to name a pen and watch, but cannot name/see "button."  Able to repeat phrases.  MMSE - Mini Mental State Exam 12/31/2016  Orientation to time 3  Orientation to Place 5  Registration 3  Attention/ Calculation 4  Recall 1  Language- name 2 objects 2  Language- repeat 1  Language- follow 3 step command 3  Language- read & follow direction - (legally blind)  Write a sentence - (legally blind)  Copy design - (legally blind)  Total score 22/27   Cranial nerves: CN I: not tested CN II: pupils equal, nonreactive to light bilaterally, cannot count fingers with both hands, does not seem to blink to threat bilaterally. Fundi unremarkable. CN III, IV, VI:  full range of motion, no nystagmus, no ptosis CN V: facial sensation intact CN VII: upper and lower face symmetric CN VIII: hearing impaired CN IX, X: gag intact, uvula midline CN XI: sternocleidomastoid and trapezius muscles intact CN XII: tongue midline Bulk & Tone: normal, no fasciculations. Motor: 5/5 throughout with no pronator drift. Sensation: reports decreased cold on right UE and LE, decreased pin on left LE. Intact vibration and joint position sense. Romberg test negative Deep Tendon Reflexes: +1 throughout, no ankle clonus Plantar responses: downgoing bilaterally Cerebellar: no incoordination when reaching for objects (cannot see finger in front of her) Gait: slow and cautious, unsteady without walker, no ataxia Tremor: none  IMPRESSION: This is a pleasant 83 year old right-handed woman with a history of hypertension, hyperlipidemia, macular degeneration and glaucoma, presenting for evaluation of visual hallucinations. Her neurological exam shows significantly reduced vision in both eyes (worse on left),  subjective sensory changes in the extremities, no focal weakness seen. Unable to do complete MMSE due to vision, she scored 22/27. 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 Central causes can include either stroke or neurodegenerative disease such as Lewy body dementia. CT head with and  without contrast will be ordered to assess for underlying structural abnormality. I would favor a primary visual etiology as the cause of her symptoms, memory issues appear to be age-related, she does not have any signs of Parkinsonism. We discussed that unless they are disturbing for her, would try to avoid medications due to potential side effects. Her daughter reports how seeing the bugs on a daily basis is bothersome for her, we agreed to start a low dose of olanzapine 2.5mg  qhs, side effects were discussed. This may potentially help with sleep as well. She will follow-up in 4 months and knows to call for any changes.   Thank you for allowing me to participate in the care of this patient. Please do not hesitate to call for any questions or concerns.   Ellouise Newer, M.D.  CC: Dr. Dema Severin

## 2016-12-31 NOTE — Patient Instructions (Signed)
1. Schedule head CT with and without contrast 2. Start olanzapine 2.5mg  at night. If too drowsy in daytime, take 1/2 tablet instead 3. Follow-up in 4 months, call for any changes

## 2017-01-12 ENCOUNTER — Other Ambulatory Visit: Payer: Medicare Other

## 2017-01-12 ENCOUNTER — Ambulatory Visit
Admission: RE | Admit: 2017-01-12 | Discharge: 2017-01-12 | Disposition: A | Discharge status: Home / Self Care | Payer: Medicare Other | Source: Ambulatory Visit | Attending: Neurology | Admitting: Neurology

## 2017-01-12 DIAGNOSIS — R42 Dizziness and giddiness: Secondary | ICD-10-CM | POA: Diagnosis not present

## 2017-01-12 DIAGNOSIS — R441 Visual hallucinations: Secondary | ICD-10-CM

## 2017-01-12 MED ORDER — IOPAMIDOL (ISOVUE-300) INJECTION 61%
75.0000 mL | Freq: Once | INTRAVENOUS | Status: AC | PRN
  Administered 2017-01-12: 75 mL via INTRAVENOUS

## 2017-01-14 ENCOUNTER — Telehealth: Payer: Self-pay

## 2017-01-14 NOTE — Telephone Encounter (Signed)
-----   Message from Cameron Sprang, MD sent at 01/13/2017  4:18 PM EDT ----- Pls let patient/family know the head CT looked good, no evidence of tumor, stroke, or bleed. It showed age-related changes. Thanks

## 2017-01-14 NOTE — Telephone Encounter (Signed)
Called pt - busy tone.  Unable to relay message.  Called and spoke with pt's daughter, Collie Siad, relaying message below.  She is happy with results but is understandably worried about pt's hallucinations.

## 2017-01-19 DIAGNOSIS — H35352 Cystoid macular degeneration, left eye: Secondary | ICD-10-CM | POA: Diagnosis not present

## 2017-01-19 DIAGNOSIS — H353221 Exudative age-related macular degeneration, left eye, with active choroidal neovascularization: Secondary | ICD-10-CM | POA: Diagnosis not present

## 2017-01-19 DIAGNOSIS — H18891 Other specified disorders of cornea, right eye: Secondary | ICD-10-CM | POA: Diagnosis not present

## 2017-01-19 DIAGNOSIS — H353134 Nonexudative age-related macular degeneration, bilateral, advanced atrophic with subfoveal involvement: Secondary | ICD-10-CM | POA: Diagnosis not present

## 2017-01-19 DIAGNOSIS — H353211 Exudative age-related macular degeneration, right eye, with active choroidal neovascularization: Secondary | ICD-10-CM | POA: Diagnosis not present

## 2017-02-14 DIAGNOSIS — H01021 Squamous blepharitis right upper eyelid: Secondary | ICD-10-CM | POA: Diagnosis not present

## 2017-02-14 DIAGNOSIS — H16223 Keratoconjunctivitis sicca, not specified as Sjogren's, bilateral: Secondary | ICD-10-CM | POA: Diagnosis not present

## 2017-02-14 DIAGNOSIS — H1859 Other hereditary corneal dystrophies: Secondary | ICD-10-CM | POA: Diagnosis not present

## 2017-02-14 DIAGNOSIS — H01022 Squamous blepharitis right lower eyelid: Secondary | ICD-10-CM | POA: Diagnosis not present

## 2017-02-14 DIAGNOSIS — H01024 Squamous blepharitis left upper eyelid: Secondary | ICD-10-CM | POA: Diagnosis not present

## 2017-02-14 DIAGNOSIS — H01025 Squamous blepharitis left lower eyelid: Secondary | ICD-10-CM | POA: Diagnosis not present

## 2017-02-16 DIAGNOSIS — E785 Hyperlipidemia, unspecified: Secondary | ICD-10-CM | POA: Diagnosis not present

## 2017-02-16 DIAGNOSIS — J4541 Moderate persistent asthma with (acute) exacerbation: Secondary | ICD-10-CM | POA: Diagnosis not present

## 2017-02-16 DIAGNOSIS — E669 Obesity, unspecified: Secondary | ICD-10-CM | POA: Diagnosis not present

## 2017-02-16 DIAGNOSIS — E119 Type 2 diabetes mellitus without complications: Secondary | ICD-10-CM | POA: Diagnosis not present

## 2017-02-16 DIAGNOSIS — I10 Essential (primary) hypertension: Secondary | ICD-10-CM | POA: Diagnosis not present

## 2017-02-16 DIAGNOSIS — R1111 Vomiting without nausea: Secondary | ICD-10-CM | POA: Diagnosis not present

## 2017-02-22 DIAGNOSIS — K219 Gastro-esophageal reflux disease without esophagitis: Secondary | ICD-10-CM | POA: Diagnosis not present

## 2017-02-22 DIAGNOSIS — R112 Nausea with vomiting, unspecified: Secondary | ICD-10-CM | POA: Diagnosis not present

## 2017-02-22 DIAGNOSIS — E669 Obesity, unspecified: Secondary | ICD-10-CM | POA: Diagnosis not present

## 2017-02-22 DIAGNOSIS — K5730 Diverticulosis of large intestine without perforation or abscess without bleeding: Secondary | ICD-10-CM | POA: Diagnosis not present

## 2017-03-17 DIAGNOSIS — H18891 Other specified disorders of cornea, right eye: Secondary | ICD-10-CM | POA: Diagnosis not present

## 2017-03-17 DIAGNOSIS — H353211 Exudative age-related macular degeneration, right eye, with active choroidal neovascularization: Secondary | ICD-10-CM | POA: Diagnosis not present

## 2017-03-17 DIAGNOSIS — H35352 Cystoid macular degeneration, left eye: Secondary | ICD-10-CM | POA: Diagnosis not present

## 2017-03-17 DIAGNOSIS — H35351 Cystoid macular degeneration, right eye: Secondary | ICD-10-CM | POA: Diagnosis not present

## 2017-03-17 DIAGNOSIS — H353221 Exudative age-related macular degeneration, left eye, with active choroidal neovascularization: Secondary | ICD-10-CM | POA: Diagnosis not present

## 2017-03-24 DIAGNOSIS — R05 Cough: Secondary | ICD-10-CM | POA: Diagnosis not present

## 2017-03-24 DIAGNOSIS — M545 Low back pain: Secondary | ICD-10-CM | POA: Diagnosis not present

## 2017-03-24 DIAGNOSIS — R350 Frequency of micturition: Secondary | ICD-10-CM | POA: Diagnosis not present

## 2017-03-24 DIAGNOSIS — H548 Legal blindness, as defined in USA: Secondary | ICD-10-CM | POA: Diagnosis not present

## 2017-04-04 DIAGNOSIS — I10 Essential (primary) hypertension: Secondary | ICD-10-CM | POA: Diagnosis not present

## 2017-04-04 DIAGNOSIS — M1990 Unspecified osteoarthritis, unspecified site: Secondary | ICD-10-CM | POA: Diagnosis not present

## 2017-04-04 DIAGNOSIS — H548 Legal blindness, as defined in USA: Secondary | ICD-10-CM | POA: Diagnosis not present

## 2017-04-04 DIAGNOSIS — M419 Scoliosis, unspecified: Secondary | ICD-10-CM | POA: Diagnosis not present

## 2017-04-04 DIAGNOSIS — Z7982 Long term (current) use of aspirin: Secondary | ICD-10-CM | POA: Diagnosis not present

## 2017-04-04 DIAGNOSIS — J45909 Unspecified asthma, uncomplicated: Secondary | ICD-10-CM | POA: Diagnosis not present

## 2017-04-04 DIAGNOSIS — E785 Hyperlipidemia, unspecified: Secondary | ICD-10-CM | POA: Diagnosis not present

## 2017-04-04 DIAGNOSIS — K219 Gastro-esophageal reflux disease without esophagitis: Secondary | ICD-10-CM | POA: Diagnosis not present

## 2017-04-04 DIAGNOSIS — K5793 Diverticulitis of intestine, part unspecified, without perforation or abscess with bleeding: Secondary | ICD-10-CM | POA: Diagnosis not present

## 2017-04-04 DIAGNOSIS — Z9181 History of falling: Secondary | ICD-10-CM | POA: Diagnosis not present

## 2017-04-04 DIAGNOSIS — K589 Irritable bowel syndrome without diarrhea: Secondary | ICD-10-CM | POA: Diagnosis not present

## 2017-04-04 DIAGNOSIS — E669 Obesity, unspecified: Secondary | ICD-10-CM | POA: Diagnosis not present

## 2017-04-04 DIAGNOSIS — E119 Type 2 diabetes mellitus without complications: Secondary | ICD-10-CM | POA: Diagnosis not present

## 2017-04-04 DIAGNOSIS — M5126 Other intervertebral disc displacement, lumbar region: Secondary | ICD-10-CM | POA: Diagnosis not present

## 2017-04-04 DIAGNOSIS — M545 Low back pain: Secondary | ICD-10-CM | POA: Diagnosis not present

## 2017-04-07 DIAGNOSIS — M5126 Other intervertebral disc displacement, lumbar region: Secondary | ICD-10-CM | POA: Diagnosis not present

## 2017-04-07 DIAGNOSIS — E119 Type 2 diabetes mellitus without complications: Secondary | ICD-10-CM | POA: Diagnosis not present

## 2017-04-07 DIAGNOSIS — M419 Scoliosis, unspecified: Secondary | ICD-10-CM | POA: Diagnosis not present

## 2017-04-07 DIAGNOSIS — I10 Essential (primary) hypertension: Secondary | ICD-10-CM | POA: Diagnosis not present

## 2017-04-07 DIAGNOSIS — K5793 Diverticulitis of intestine, part unspecified, without perforation or abscess with bleeding: Secondary | ICD-10-CM | POA: Diagnosis not present

## 2017-04-07 DIAGNOSIS — M545 Low back pain: Secondary | ICD-10-CM | POA: Diagnosis not present

## 2017-04-08 DIAGNOSIS — E119 Type 2 diabetes mellitus without complications: Secondary | ICD-10-CM | POA: Diagnosis not present

## 2017-04-08 DIAGNOSIS — M545 Low back pain: Secondary | ICD-10-CM | POA: Diagnosis not present

## 2017-04-08 DIAGNOSIS — M5126 Other intervertebral disc displacement, lumbar region: Secondary | ICD-10-CM | POA: Diagnosis not present

## 2017-04-08 DIAGNOSIS — M419 Scoliosis, unspecified: Secondary | ICD-10-CM | POA: Diagnosis not present

## 2017-04-08 DIAGNOSIS — I10 Essential (primary) hypertension: Secondary | ICD-10-CM | POA: Diagnosis not present

## 2017-04-08 DIAGNOSIS — K5793 Diverticulitis of intestine, part unspecified, without perforation or abscess with bleeding: Secondary | ICD-10-CM | POA: Diagnosis not present

## 2017-04-13 DIAGNOSIS — E119 Type 2 diabetes mellitus without complications: Secondary | ICD-10-CM | POA: Diagnosis not present

## 2017-04-13 DIAGNOSIS — M419 Scoliosis, unspecified: Secondary | ICD-10-CM | POA: Diagnosis not present

## 2017-04-13 DIAGNOSIS — M5126 Other intervertebral disc displacement, lumbar region: Secondary | ICD-10-CM | POA: Diagnosis not present

## 2017-04-13 DIAGNOSIS — K5793 Diverticulitis of intestine, part unspecified, without perforation or abscess with bleeding: Secondary | ICD-10-CM | POA: Diagnosis not present

## 2017-04-13 DIAGNOSIS — I10 Essential (primary) hypertension: Secondary | ICD-10-CM | POA: Diagnosis not present

## 2017-04-13 DIAGNOSIS — M545 Low back pain: Secondary | ICD-10-CM | POA: Diagnosis not present

## 2017-04-15 DIAGNOSIS — M419 Scoliosis, unspecified: Secondary | ICD-10-CM | POA: Diagnosis not present

## 2017-04-15 DIAGNOSIS — E119 Type 2 diabetes mellitus without complications: Secondary | ICD-10-CM | POA: Diagnosis not present

## 2017-04-15 DIAGNOSIS — M5126 Other intervertebral disc displacement, lumbar region: Secondary | ICD-10-CM | POA: Diagnosis not present

## 2017-04-15 DIAGNOSIS — M545 Low back pain: Secondary | ICD-10-CM | POA: Diagnosis not present

## 2017-04-15 DIAGNOSIS — K5793 Diverticulitis of intestine, part unspecified, without perforation or abscess with bleeding: Secondary | ICD-10-CM | POA: Diagnosis not present

## 2017-04-15 DIAGNOSIS — I10 Essential (primary) hypertension: Secondary | ICD-10-CM | POA: Diagnosis not present

## 2017-04-19 DIAGNOSIS — K5793 Diverticulitis of intestine, part unspecified, without perforation or abscess with bleeding: Secondary | ICD-10-CM | POA: Diagnosis not present

## 2017-04-19 DIAGNOSIS — M545 Low back pain: Secondary | ICD-10-CM | POA: Diagnosis not present

## 2017-04-19 DIAGNOSIS — M5126 Other intervertebral disc displacement, lumbar region: Secondary | ICD-10-CM | POA: Diagnosis not present

## 2017-04-19 DIAGNOSIS — E119 Type 2 diabetes mellitus without complications: Secondary | ICD-10-CM | POA: Diagnosis not present

## 2017-04-19 DIAGNOSIS — I10 Essential (primary) hypertension: Secondary | ICD-10-CM | POA: Diagnosis not present

## 2017-04-19 DIAGNOSIS — M419 Scoliosis, unspecified: Secondary | ICD-10-CM | POA: Diagnosis not present

## 2017-04-20 DIAGNOSIS — I10 Essential (primary) hypertension: Secondary | ICD-10-CM | POA: Diagnosis not present

## 2017-04-20 DIAGNOSIS — E119 Type 2 diabetes mellitus without complications: Secondary | ICD-10-CM | POA: Diagnosis not present

## 2017-04-20 DIAGNOSIS — M419 Scoliosis, unspecified: Secondary | ICD-10-CM | POA: Diagnosis not present

## 2017-04-20 DIAGNOSIS — K5793 Diverticulitis of intestine, part unspecified, without perforation or abscess with bleeding: Secondary | ICD-10-CM | POA: Diagnosis not present

## 2017-04-20 DIAGNOSIS — M5126 Other intervertebral disc displacement, lumbar region: Secondary | ICD-10-CM | POA: Diagnosis not present

## 2017-04-20 DIAGNOSIS — M545 Low back pain: Secondary | ICD-10-CM | POA: Diagnosis not present

## 2017-04-21 DIAGNOSIS — M5126 Other intervertebral disc displacement, lumbar region: Secondary | ICD-10-CM | POA: Diagnosis not present

## 2017-04-21 DIAGNOSIS — E119 Type 2 diabetes mellitus without complications: Secondary | ICD-10-CM | POA: Diagnosis not present

## 2017-04-21 DIAGNOSIS — K5793 Diverticulitis of intestine, part unspecified, without perforation or abscess with bleeding: Secondary | ICD-10-CM | POA: Diagnosis not present

## 2017-04-21 DIAGNOSIS — M545 Low back pain: Secondary | ICD-10-CM | POA: Diagnosis not present

## 2017-04-21 DIAGNOSIS — I10 Essential (primary) hypertension: Secondary | ICD-10-CM | POA: Diagnosis not present

## 2017-04-21 DIAGNOSIS — M419 Scoliosis, unspecified: Secondary | ICD-10-CM | POA: Diagnosis not present

## 2017-04-26 ENCOUNTER — Ambulatory Visit: Payer: Medicare Other | Admitting: Neurology

## 2017-04-26 DIAGNOSIS — M5126 Other intervertebral disc displacement, lumbar region: Secondary | ICD-10-CM | POA: Diagnosis not present

## 2017-04-26 DIAGNOSIS — I10 Essential (primary) hypertension: Secondary | ICD-10-CM | POA: Diagnosis not present

## 2017-04-26 DIAGNOSIS — E119 Type 2 diabetes mellitus without complications: Secondary | ICD-10-CM | POA: Diagnosis not present

## 2017-04-26 DIAGNOSIS — K5793 Diverticulitis of intestine, part unspecified, without perforation or abscess with bleeding: Secondary | ICD-10-CM | POA: Diagnosis not present

## 2017-04-26 DIAGNOSIS — M419 Scoliosis, unspecified: Secondary | ICD-10-CM | POA: Diagnosis not present

## 2017-04-26 DIAGNOSIS — M545 Low back pain: Secondary | ICD-10-CM | POA: Diagnosis not present

## 2017-04-28 DIAGNOSIS — I10 Essential (primary) hypertension: Secondary | ICD-10-CM | POA: Diagnosis not present

## 2017-04-28 DIAGNOSIS — M5126 Other intervertebral disc displacement, lumbar region: Secondary | ICD-10-CM | POA: Diagnosis not present

## 2017-04-28 DIAGNOSIS — M419 Scoliosis, unspecified: Secondary | ICD-10-CM | POA: Diagnosis not present

## 2017-04-28 DIAGNOSIS — K5793 Diverticulitis of intestine, part unspecified, without perforation or abscess with bleeding: Secondary | ICD-10-CM | POA: Diagnosis not present

## 2017-04-28 DIAGNOSIS — M545 Low back pain: Secondary | ICD-10-CM | POA: Diagnosis not present

## 2017-04-28 DIAGNOSIS — E119 Type 2 diabetes mellitus without complications: Secondary | ICD-10-CM | POA: Diagnosis not present

## 2017-05-03 DIAGNOSIS — E119 Type 2 diabetes mellitus without complications: Secondary | ICD-10-CM | POA: Diagnosis not present

## 2017-05-03 DIAGNOSIS — M5126 Other intervertebral disc displacement, lumbar region: Secondary | ICD-10-CM | POA: Diagnosis not present

## 2017-05-03 DIAGNOSIS — I10 Essential (primary) hypertension: Secondary | ICD-10-CM | POA: Diagnosis not present

## 2017-05-03 DIAGNOSIS — M545 Low back pain: Secondary | ICD-10-CM | POA: Diagnosis not present

## 2017-05-03 DIAGNOSIS — M419 Scoliosis, unspecified: Secondary | ICD-10-CM | POA: Diagnosis not present

## 2017-05-03 DIAGNOSIS — K5793 Diverticulitis of intestine, part unspecified, without perforation or abscess with bleeding: Secondary | ICD-10-CM | POA: Diagnosis not present

## 2017-05-11 DIAGNOSIS — K5793 Diverticulitis of intestine, part unspecified, without perforation or abscess with bleeding: Secondary | ICD-10-CM | POA: Diagnosis not present

## 2017-05-11 DIAGNOSIS — M545 Low back pain: Secondary | ICD-10-CM | POA: Diagnosis not present

## 2017-05-11 DIAGNOSIS — M5126 Other intervertebral disc displacement, lumbar region: Secondary | ICD-10-CM | POA: Diagnosis not present

## 2017-05-11 DIAGNOSIS — I10 Essential (primary) hypertension: Secondary | ICD-10-CM | POA: Diagnosis not present

## 2017-05-11 DIAGNOSIS — E119 Type 2 diabetes mellitus without complications: Secondary | ICD-10-CM | POA: Diagnosis not present

## 2017-05-11 DIAGNOSIS — M419 Scoliosis, unspecified: Secondary | ICD-10-CM | POA: Diagnosis not present

## 2017-05-17 DIAGNOSIS — H18891 Other specified disorders of cornea, right eye: Secondary | ICD-10-CM | POA: Diagnosis not present

## 2017-05-17 DIAGNOSIS — H4010X3 Unspecified open-angle glaucoma, severe stage: Secondary | ICD-10-CM | POA: Diagnosis not present

## 2017-05-17 DIAGNOSIS — H35351 Cystoid macular degeneration, right eye: Secondary | ICD-10-CM | POA: Diagnosis not present

## 2017-05-17 DIAGNOSIS — H353211 Exudative age-related macular degeneration, right eye, with active choroidal neovascularization: Secondary | ICD-10-CM | POA: Diagnosis not present

## 2017-05-17 DIAGNOSIS — H43811 Vitreous degeneration, right eye: Secondary | ICD-10-CM | POA: Diagnosis not present

## 2017-05-18 DIAGNOSIS — S32039G Unspecified fracture of third lumbar vertebra, subsequent encounter for fracture with delayed healing: Secondary | ICD-10-CM | POA: Diagnosis not present

## 2017-05-18 DIAGNOSIS — M545 Low back pain: Secondary | ICD-10-CM | POA: Diagnosis not present

## 2017-05-18 DIAGNOSIS — R937 Abnormal findings on diagnostic imaging of other parts of musculoskeletal system: Secondary | ICD-10-CM | POA: Diagnosis not present

## 2017-05-19 ENCOUNTER — Other Ambulatory Visit: Payer: Self-pay | Admitting: Family Medicine

## 2017-05-19 DIAGNOSIS — M545 Low back pain, unspecified: Secondary | ICD-10-CM

## 2017-05-19 DIAGNOSIS — S32039G Unspecified fracture of third lumbar vertebra, subsequent encounter for fracture with delayed healing: Secondary | ICD-10-CM

## 2017-05-19 DIAGNOSIS — E119 Type 2 diabetes mellitus without complications: Secondary | ICD-10-CM | POA: Diagnosis not present

## 2017-05-19 DIAGNOSIS — M419 Scoliosis, unspecified: Secondary | ICD-10-CM | POA: Diagnosis not present

## 2017-05-19 DIAGNOSIS — K5793 Diverticulitis of intestine, part unspecified, without perforation or abscess with bleeding: Secondary | ICD-10-CM | POA: Diagnosis not present

## 2017-05-19 DIAGNOSIS — I10 Essential (primary) hypertension: Secondary | ICD-10-CM | POA: Diagnosis not present

## 2017-05-19 DIAGNOSIS — M5126 Other intervertebral disc displacement, lumbar region: Secondary | ICD-10-CM | POA: Diagnosis not present

## 2017-05-24 ENCOUNTER — Ambulatory Visit
Admission: RE | Admit: 2017-05-24 | Discharge: 2017-05-24 | Disposition: A | Discharge status: Home / Self Care | Payer: Medicare Other | Source: Ambulatory Visit | Attending: Family Medicine | Admitting: Family Medicine

## 2017-05-24 DIAGNOSIS — M48061 Spinal stenosis, lumbar region without neurogenic claudication: Secondary | ICD-10-CM | POA: Diagnosis not present

## 2017-05-24 DIAGNOSIS — M545 Low back pain, unspecified: Secondary | ICD-10-CM

## 2017-05-24 DIAGNOSIS — S32039G Unspecified fracture of third lumbar vertebra, subsequent encounter for fracture with delayed healing: Secondary | ICD-10-CM

## 2017-05-26 DIAGNOSIS — S2232XA Fracture of one rib, left side, initial encounter for closed fracture: Secondary | ICD-10-CM | POA: Diagnosis not present

## 2017-05-26 DIAGNOSIS — R0781 Pleurodynia: Secondary | ICD-10-CM | POA: Diagnosis not present

## 2017-05-27 DIAGNOSIS — M5126 Other intervertebral disc displacement, lumbar region: Secondary | ICD-10-CM | POA: Diagnosis not present

## 2017-05-27 DIAGNOSIS — M545 Low back pain: Secondary | ICD-10-CM | POA: Diagnosis not present

## 2017-05-27 DIAGNOSIS — M419 Scoliosis, unspecified: Secondary | ICD-10-CM | POA: Diagnosis not present

## 2017-05-27 DIAGNOSIS — E119 Type 2 diabetes mellitus without complications: Secondary | ICD-10-CM | POA: Diagnosis not present

## 2017-05-27 DIAGNOSIS — I10 Essential (primary) hypertension: Secondary | ICD-10-CM | POA: Diagnosis not present

## 2017-05-27 DIAGNOSIS — K5793 Diverticulitis of intestine, part unspecified, without perforation or abscess with bleeding: Secondary | ICD-10-CM | POA: Diagnosis not present

## 2017-05-31 DIAGNOSIS — M545 Low back pain: Secondary | ICD-10-CM | POA: Diagnosis not present

## 2017-06-16 DIAGNOSIS — E441 Mild protein-calorie malnutrition: Secondary | ICD-10-CM | POA: Diagnosis not present

## 2017-06-16 DIAGNOSIS — S2242XD Multiple fractures of ribs, left side, subsequent encounter for fracture with routine healing: Secondary | ICD-10-CM | POA: Diagnosis not present

## 2017-06-16 DIAGNOSIS — E119 Type 2 diabetes mellitus without complications: Secondary | ICD-10-CM | POA: Diagnosis not present

## 2017-06-16 DIAGNOSIS — I10 Essential (primary) hypertension: Secondary | ICD-10-CM | POA: Diagnosis not present

## 2017-06-16 DIAGNOSIS — R609 Edema, unspecified: Secondary | ICD-10-CM | POA: Diagnosis not present

## 2017-06-16 DIAGNOSIS — E785 Hyperlipidemia, unspecified: Secondary | ICD-10-CM | POA: Diagnosis not present

## 2017-08-22 DIAGNOSIS — H401133 Primary open-angle glaucoma, bilateral, severe stage: Secondary | ICD-10-CM | POA: Diagnosis not present

## 2017-08-22 DIAGNOSIS — H1812 Bullous keratopathy, left eye: Secondary | ICD-10-CM | POA: Diagnosis not present

## 2017-09-16 DIAGNOSIS — E8809 Other disorders of plasma-protein metabolism, not elsewhere classified: Secondary | ICD-10-CM | POA: Diagnosis not present

## 2017-09-16 DIAGNOSIS — E785 Hyperlipidemia, unspecified: Secondary | ICD-10-CM | POA: Diagnosis not present

## 2017-09-16 DIAGNOSIS — E1169 Type 2 diabetes mellitus with other specified complication: Secondary | ICD-10-CM | POA: Diagnosis not present

## 2017-09-16 DIAGNOSIS — E441 Mild protein-calorie malnutrition: Secondary | ICD-10-CM | POA: Diagnosis not present

## 2017-09-16 DIAGNOSIS — I10 Essential (primary) hypertension: Secondary | ICD-10-CM | POA: Diagnosis not present

## 2017-10-18 DIAGNOSIS — L900 Lichen sclerosus et atrophicus: Secondary | ICD-10-CM | POA: Diagnosis not present

## 2017-11-16 DIAGNOSIS — R42 Dizziness and giddiness: Secondary | ICD-10-CM | POA: Diagnosis not present

## 2017-11-16 DIAGNOSIS — R609 Edema, unspecified: Secondary | ICD-10-CM | POA: Diagnosis not present

## 2017-11-16 DIAGNOSIS — I10 Essential (primary) hypertension: Secondary | ICD-10-CM | POA: Diagnosis not present

## 2017-11-16 DIAGNOSIS — M79645 Pain in left finger(s): Secondary | ICD-10-CM | POA: Diagnosis not present

## 2017-12-23 DIAGNOSIS — Z23 Encounter for immunization: Secondary | ICD-10-CM | POA: Diagnosis not present

## 2018-01-16 DIAGNOSIS — E441 Mild protein-calorie malnutrition: Secondary | ICD-10-CM | POA: Diagnosis not present

## 2018-01-16 DIAGNOSIS — E1169 Type 2 diabetes mellitus with other specified complication: Secondary | ICD-10-CM | POA: Diagnosis not present

## 2018-01-16 DIAGNOSIS — E785 Hyperlipidemia, unspecified: Secondary | ICD-10-CM | POA: Diagnosis not present

## 2018-01-16 DIAGNOSIS — R609 Edema, unspecified: Secondary | ICD-10-CM | POA: Diagnosis not present

## 2018-01-16 DIAGNOSIS — H548 Legal blindness, as defined in USA: Secondary | ICD-10-CM | POA: Diagnosis not present

## 2018-01-16 DIAGNOSIS — J3489 Other specified disorders of nose and nasal sinuses: Secondary | ICD-10-CM | POA: Diagnosis not present

## 2018-01-16 DIAGNOSIS — I10 Essential (primary) hypertension: Secondary | ICD-10-CM | POA: Diagnosis not present

## 2018-02-06 DIAGNOSIS — H401133 Primary open-angle glaucoma, bilateral, severe stage: Secondary | ICD-10-CM | POA: Diagnosis not present

## 2018-02-06 DIAGNOSIS — H1812 Bullous keratopathy, left eye: Secondary | ICD-10-CM | POA: Diagnosis not present

## 2018-04-26 DIAGNOSIS — Z961 Presence of intraocular lens: Secondary | ICD-10-CM | POA: Diagnosis not present

## 2018-04-26 DIAGNOSIS — H1859 Other hereditary corneal dystrophies: Secondary | ICD-10-CM | POA: Diagnosis not present

## 2018-04-26 DIAGNOSIS — H353230 Exudative age-related macular degeneration, bilateral, stage unspecified: Secondary | ICD-10-CM | POA: Diagnosis not present

## 2018-04-26 DIAGNOSIS — H1812 Bullous keratopathy, left eye: Secondary | ICD-10-CM | POA: Diagnosis not present

## 2018-04-26 DIAGNOSIS — H3589 Other specified retinal disorders: Secondary | ICD-10-CM | POA: Diagnosis not present

## 2018-04-26 DIAGNOSIS — H401133 Primary open-angle glaucoma, bilateral, severe stage: Secondary | ICD-10-CM | POA: Diagnosis not present

## 2018-04-26 DIAGNOSIS — Z9889 Other specified postprocedural states: Secondary | ICD-10-CM | POA: Diagnosis not present

## 2018-04-26 DIAGNOSIS — H16223 Keratoconjunctivitis sicca, not specified as Sjogren's, bilateral: Secondary | ICD-10-CM | POA: Diagnosis not present

## 2018-05-22 DIAGNOSIS — L219 Seborrheic dermatitis, unspecified: Secondary | ICD-10-CM | POA: Diagnosis not present

## 2018-05-22 DIAGNOSIS — E785 Hyperlipidemia, unspecified: Secondary | ICD-10-CM | POA: Diagnosis not present

## 2018-05-22 DIAGNOSIS — E46 Unspecified protein-calorie malnutrition: Secondary | ICD-10-CM | POA: Diagnosis not present

## 2018-05-22 DIAGNOSIS — E441 Mild protein-calorie malnutrition: Secondary | ICD-10-CM | POA: Diagnosis not present

## 2018-05-22 DIAGNOSIS — I10 Essential (primary) hypertension: Secondary | ICD-10-CM | POA: Diagnosis not present

## 2018-05-22 DIAGNOSIS — R441 Visual hallucinations: Secondary | ICD-10-CM | POA: Diagnosis not present

## 2018-05-22 DIAGNOSIS — E1169 Type 2 diabetes mellitus with other specified complication: Secondary | ICD-10-CM | POA: Diagnosis not present

## 2018-05-22 DIAGNOSIS — D7589 Other specified diseases of blood and blood-forming organs: Secondary | ICD-10-CM | POA: Diagnosis not present

## 2018-08-17 DIAGNOSIS — E785 Hyperlipidemia, unspecified: Secondary | ICD-10-CM | POA: Diagnosis not present

## 2018-08-17 DIAGNOSIS — I10 Essential (primary) hypertension: Secondary | ICD-10-CM | POA: Diagnosis not present

## 2018-08-17 DIAGNOSIS — E1169 Type 2 diabetes mellitus with other specified complication: Secondary | ICD-10-CM | POA: Diagnosis not present

## 2018-08-17 DIAGNOSIS — G459 Transient cerebral ischemic attack, unspecified: Secondary | ICD-10-CM | POA: Diagnosis not present

## 2018-08-17 DIAGNOSIS — J4540 Moderate persistent asthma, uncomplicated: Secondary | ICD-10-CM | POA: Diagnosis not present

## 2018-10-03 DIAGNOSIS — R443 Hallucinations, unspecified: Secondary | ICD-10-CM | POA: Diagnosis not present

## 2018-10-20 DIAGNOSIS — L900 Lichen sclerosus et atrophicus: Secondary | ICD-10-CM | POA: Diagnosis not present

## 2018-10-20 DIAGNOSIS — R300 Dysuria: Secondary | ICD-10-CM | POA: Diagnosis not present

## 2018-11-07 ENCOUNTER — Observation Stay (HOSPITAL_COMMUNITY)
Admission: EM | Admit: 2018-11-07 | Discharge: 2018-11-08 | Disposition: A | Discharge status: Home / Self Care | Payer: Medicare Other | Attending: Family Medicine | Admitting: Family Medicine

## 2018-11-07 ENCOUNTER — Emergency Department (HOSPITAL_COMMUNITY): Payer: Medicare Other

## 2018-11-07 ENCOUNTER — Encounter (HOSPITAL_COMMUNITY): Payer: Self-pay | Admitting: Student

## 2018-11-07 DIAGNOSIS — I499 Cardiac arrhythmia, unspecified: Secondary | ICD-10-CM | POA: Diagnosis not present

## 2018-11-07 DIAGNOSIS — R079 Chest pain, unspecified: Secondary | ICD-10-CM | POA: Diagnosis present

## 2018-11-07 DIAGNOSIS — R0602 Shortness of breath: Secondary | ICD-10-CM | POA: Diagnosis not present

## 2018-11-07 DIAGNOSIS — Z7982 Long term (current) use of aspirin: Secondary | ICD-10-CM | POA: Insufficient documentation

## 2018-11-07 DIAGNOSIS — J45909 Unspecified asthma, uncomplicated: Secondary | ICD-10-CM | POA: Diagnosis not present

## 2018-11-07 DIAGNOSIS — I4510 Unspecified right bundle-branch block: Secondary | ICD-10-CM | POA: Diagnosis present

## 2018-11-07 DIAGNOSIS — R000 Tachycardia, unspecified: Secondary | ICD-10-CM | POA: Diagnosis not present

## 2018-11-07 DIAGNOSIS — E1165 Type 2 diabetes mellitus with hyperglycemia: Secondary | ICD-10-CM | POA: Diagnosis not present

## 2018-11-07 DIAGNOSIS — I480 Paroxysmal atrial fibrillation: Secondary | ICD-10-CM | POA: Diagnosis not present

## 2018-11-07 DIAGNOSIS — I4891 Unspecified atrial fibrillation: Secondary | ICD-10-CM | POA: Diagnosis not present

## 2018-11-07 DIAGNOSIS — I5031 Acute diastolic (congestive) heart failure: Secondary | ICD-10-CM | POA: Diagnosis not present

## 2018-11-07 DIAGNOSIS — Z79899 Other long term (current) drug therapy: Secondary | ICD-10-CM | POA: Diagnosis not present

## 2018-11-07 DIAGNOSIS — Z20828 Contact with and (suspected) exposure to other viral communicable diseases: Secondary | ICD-10-CM | POA: Insufficient documentation

## 2018-11-07 DIAGNOSIS — I110 Hypertensive heart disease with heart failure: Secondary | ICD-10-CM | POA: Diagnosis not present

## 2018-11-07 DIAGNOSIS — R0609 Other forms of dyspnea: Secondary | ICD-10-CM

## 2018-11-07 DIAGNOSIS — R0789 Other chest pain: Secondary | ICD-10-CM | POA: Diagnosis not present

## 2018-11-07 LAB — COMPREHENSIVE METABOLIC PANEL
ALT: 13 U/L (ref 0–44)
AST: 21 U/L (ref 15–41)
Albumin: 3.1 g/dL — ABNORMAL LOW (ref 3.5–5.0)
Alkaline Phosphatase: 62 U/L (ref 38–126)
Anion gap: 12 (ref 5–15)
BUN: 16 mg/dL (ref 8–23)
CO2: 21 mmol/L — ABNORMAL LOW (ref 22–32)
Calcium: 8.8 mg/dL — ABNORMAL LOW (ref 8.9–10.3)
Chloride: 107 mmol/L (ref 98–111)
Creatinine, Ser: 1 mg/dL (ref 0.44–1.00)
GFR calc Af Amer: 55 mL/min — ABNORMAL LOW (ref >60)
GFR calc non Af Amer: 47 mL/min — ABNORMAL LOW (ref >60)
Glucose, Bld: 232 mg/dL — ABNORMAL HIGH (ref 70–99)
Potassium: 4 mmol/L (ref 3.5–5.1)
Sodium: 140 mmol/L (ref 135–145)
Total Bilirubin: 0.6 mg/dL (ref 0.3–1.2)
Total Protein: 5.6 g/dL — ABNORMAL LOW (ref 6.5–8.1)

## 2018-11-07 LAB — CBC WITH DIFFERENTIAL/PLATELET
Abs Immature Granulocytes: 0.02 10*3/uL (ref 0.00–0.07)
Basophils Absolute: 0 10*3/uL (ref 0.0–0.1)
Basophils Relative: 1 %
Eosinophils Absolute: 0.1 10*3/uL (ref 0.0–0.5)
Eosinophils Relative: 1 %
HCT: 42.3 % (ref 36.0–46.0)
Hemoglobin: 13.4 g/dL (ref 12.0–15.0)
Immature Granulocytes: 0 %
Lymphocytes Relative: 27 %
Lymphs Abs: 2.4 10*3/uL (ref 0.7–4.0)
MCH: 32.4 pg (ref 26.0–34.0)
MCHC: 31.7 g/dL (ref 30.0–36.0)
MCV: 102.4 fL — ABNORMAL HIGH (ref 80.0–100.0)
Monocytes Absolute: 0.6 10*3/uL (ref 0.1–1.0)
Monocytes Relative: 7 %
Neutro Abs: 5.6 10*3/uL (ref 1.7–7.7)
Neutrophils Relative %: 64 %
Platelets: 198 10*3/uL (ref 150–400)
RBC: 4.13 MIL/uL (ref 3.87–5.11)
RDW: 13.7 % (ref 11.5–15.5)
WBC: 8.7 10*3/uL (ref 4.0–10.5)
nRBC: 0 % (ref 0.0–0.2)

## 2018-11-07 LAB — BRAIN NATRIURETIC PEPTIDE: B Natriuretic Peptide: 198 pg/mL — ABNORMAL HIGH (ref 0.0–100.0)

## 2018-11-07 LAB — TROPONIN I (HIGH SENSITIVITY)
Troponin I (High Sensitivity): 20 ng/L — ABNORMAL HIGH (ref <18)
Troponin I (High Sensitivity): 37 ng/L — ABNORMAL HIGH (ref <18)

## 2018-11-07 MED ORDER — FUROSEMIDE 10 MG/ML IJ SOLN
40.0000 mg | Freq: Once | INTRAMUSCULAR | Status: AC
  Administered 2018-11-08: 40 mg via INTRAVENOUS
  Filled 2018-11-07: qty 4

## 2018-11-07 MED ORDER — ONDANSETRON HCL 4 MG/2ML IJ SOLN: 4.0000 mg | Freq: Four times a day (QID) | INTRAMUSCULAR | Status: DC | PRN

## 2018-11-07 MED ORDER — SODIUM CHLORIDE 0.9% FLUSH
3.0000 mL | Freq: Two times a day (BID) | INTRAVENOUS | Status: DC
  Administered 2018-11-08: 3 mL via INTRAVENOUS

## 2018-11-07 MED ORDER — ENOXAPARIN SODIUM 40 MG/0.4ML ~~LOC~~ SOLN
40.0000 mg | SUBCUTANEOUS | Status: DC
  Filled 2018-11-07: qty 0.4

## 2018-11-07 MED ORDER — SODIUM CHLORIDE 0.9% FLUSH: 3.0000 mL | INTRAVENOUS | Status: DC | PRN

## 2018-11-07 MED ORDER — ASPIRIN 81 MG PO CHEW
324.0000 mg | CHEWABLE_TABLET | Freq: Once | ORAL | Status: DC
  Filled 2018-11-07: qty 4

## 2018-11-07 MED ORDER — SODIUM CHLORIDE 0.9 % IV SOLN: 250.0000 mL | INTRAVENOUS | Status: DC | PRN

## 2018-11-07 MED ORDER — ACETAMINOPHEN 325 MG PO TABS: 650.0000 mg | ORAL_TABLET | ORAL | Status: DC | PRN

## 2018-11-07 MED ORDER — METOPROLOL TARTRATE 12.5 MG HALF TABLET
12.5000 mg | ORAL_TABLET | Freq: Two times a day (BID) | ORAL | Status: DC
  Administered 2018-11-08: 12.5 mg via ORAL
  Filled 2018-11-07: qty 1

## 2018-11-07 NOTE — ED Notes (Signed)
Messaged pharmacy to verify medications 

## 2018-11-07 NOTE — ED Provider Notes (Signed)
Medical screening examination/treatment/procedure(s) were conducted as a shared visit with non-physician practitioner(s) and myself.  I personally evaluated the patient during the encounter. Briefly, the patient is a 85 y.o. female with history of hypertension, reflux, who presents to the ED with chest pain, shortness of breath.  Patient with EKG that showed sinus rhythm.  Patient with shortness of breath with exertion and chest pain, back pain.  Patient with somewhat reproducible chest pain and back pain on exam.  However she feels short of breath mostly with exertion.  Has some swelling in her legs.  Overall coarse breath sounds throughout.  Takes Lasix but does not know if she has heart failure.  Has never had a heart attack to her knowledge.  No echocardiogram or stress test in her system.  Patient denies any infectious symptoms.  Chest x-ray shows no signs of pneumonia, no pneumothorax, no pleural effusion.  Troponin and BNP both mildly elevated.  No significant anemia, electrolyte abnormality, kidney injury.  Concern for possible volume overload.  Lesser concern for ACS.  Will discuss with hospitalist about observation admission for possibly echocardiogram, diuresis, trending of troponins.  Patient is not hypoxic, no risk factors for PE and doubt PE.  Will discuss with hospitalist for admission.  This chart was dictated using voice recognition software.  Despite best efforts to proofread,  errors can occur which can change the documentation meaning.     EKG Interpretation  Date/Time:  Tuesday November 07 2018 19:46:17 EDT Ventricular Rate:  105 PR Interval:    QRS Duration: 126 QT Interval:  368 QTC Calculation: 487 R Axis:   87 Text Interpretation:  Sinus tachycardia Multiform ventricular premature complexes Right bundle branch block Confirmed by Lennice Sites [phone]) on 11/07/2018 7:54:49 PM          Lennice Sites, DO 11/07/18 2157

## 2018-11-07 NOTE — ED Triage Notes (Signed)
Pt stated son brought her in to have chest pain checked out. Chest/back pain happening for a few days-week

## 2018-11-07 NOTE — ED Provider Notes (Signed)
[facility] EMERGENCY DEPARTMENT Provider Note   CSN: [id number] Arrival date & time: 11/07/18  1941     History   Chief Complaint Chief Complaint  Patient presents with  . Chest Pain    HPI Connie Obrien is a 85 y.o. female with a hx of asthma, HTN, GERD, IBS, obesity, & mild cognitive impairment who presents to the ED w/ complaints of chest pain that has been waxing/waning for the past few days. Patient describes the pain as being to the diffuse anterior chest & to her back. She states it does not radiate straight through. She felt as though her bra was too tight when trying to describe the pain, but this was adjusted without much change. She states the pain is not necessarily worse with exertion but that it improves some with rest. She reports associated fatigue. Has dyspnea on exertion which she states has been going on for years. Her legs have been a been painful but this is bilateral, she is unsure if legs are more swollen than usual. Denies fever, chills, cough, hemoptysis, recent travel/hospitalization/surgery, hormone use, prior VTE, or prior diagnosis of cancer. Denies cardiac hx.   Per EMS, the believed patient to be in afib w/ RVR with a rate 150s-180s with spontaneous conversion to sinus tachycardia @ 104-110.      HPI  Past Medical History:  Diagnosis Date  . Allergic rhinitis   . Asthma   . Deaf   . Diverticulosis   . GERD (gastroesophageal reflux disease)   . Glaucoma   . HTN (hypertension)   . IBS (irritable bowel syndrome)   . Obesity   . Osteoarthritis   . Osteopenia   . Scoliosis     Patient Active Problem List   Diagnosis Date Noted  . Visual hallucinations 12/31/2016  . Mild cognitive impairment 12/31/2016  . Pruritus of skin 09/02/2011  . Acute bronchitis 06/14/2011  . Asthma 09/29/2010  . Cough 08/28/2010  . Dyspnea 08/28/2010    Past Surgical History:  Procedure Laterality Date  . ABDOMINAL HERNIA REPAIR    .  APPENDECTOMY    . BREAST BIOPSY    . CATARACT EXTRACTION    . COLON RESECTION    . HEMORRHOID SURGERY    . NISSEN FUNDOPLICATION    . TOTAL ABDOMINAL HYSTERECTOMY       OB History   No obstetric history on file.      Home Medications    Prior to Admission medications   Medication Sig Start Date End Date Taking? Authorizing Provider  aspirin 325 MG EC tablet Take 325 mg by mouth daily.    [provider]  calcium citrate-vitamin D (CITRACAL+D) 315-200 MG-UNIT per tablet Take 1 tablet by mouth 2 (two) times daily.     [provider]  cetirizine (ZYRTEC) 10 MG tablet Take 10 mg by mouth daily.      [provider]  cycloSPORINE (RESTASIS) 0.05 % ophthalmic emulsion Place 1 drop into both eyes 2 (two) times daily.    [provider]  dorzolamide-timolol (COSOPT) 22.3-6.8 MG/ML ophthalmic solution Place 1 drop into both eyes 2 (two) times daily.    [provider]  estradiol (ESTRACE) 0.1 MG/GM vaginal cream Place 2 g vaginally as needed.     [provider]  furosemide (LASIX) 40 MG tablet Take 40 mg by mouth Daily.  04/21/11   [provider]  losartan (COZAAR) 100 MG tablet Take 100 mg by mouth daily.  08/07/11   [provider]  Nutritional Supplements (PRO-FLORA PLUS) CAPS Take 1 capsule by mouth daily. 10/02/10   Parrett, Fonnie Mu, NP  OLANZapine (ZYPREXA) 2.5 MG tablet Take 1 tablet (2.5 mg total) by mouth at bedtime. 12/31/16   Cameron Sprang, MD  pravastatin (PRAVACHOL) 40 MG tablet Take 1 tablet (40 mg total) by mouth at bedtime. 10/02/10   Parrett, Fonnie Mu, NP  SYMBICORT 160-4.5 MCG/ACT inhaler INHALE ONE PUFF INTO LUNGS TWICE DAILY 08/30/14   Brand Males, MD  VENTOLIN HFA 108 (90 BASE) MCG/ACT inhaler Inhale 1 puff into the lungs Every 6 hours as needed for wheezing or shortness of breath.  04/26/11   [provider]  vitamin B-12 (CYANOCOBALAMIN) 500 MCG tablet Take 500 mcg by mouth daily.     [provider]  vitamin D, CHOLECALCIFEROL, 400 UNITS tablet Take 400 Units by mouth daily.      [provider]  vitamin E 400 UNIT capsule Take 400 Units by mouth daily.      [provider]  beclomethasone (QVAR) 80 MCG/ACT inhaler Inhale 2 puffs into the lungs 2 (two) times daily. 09/29/10 05/31/11  Parrett, Fonnie Mu, NP    Family History Family History  Problem Relation Age of Onset  . Coronary artery disease Father   . Diabetes Father   . Coronary artery disease Mother   . Coronary artery disease Brother   . Prostate cancer Brother        x2    Social History Social History   Tobacco Use  . Smoking status: Never Smoker  . Smokeless tobacco: Never Used  Substance Use Topics  . Alcohol use: No  . Drug use: No     Allergies   Septra [bactrim] and Sulfa antibiotics   Review of Systems Review of Systems  Constitutional: Positive for fatigue. Negative for chills, diaphoresis and fever.  Respiratory: Positive for shortness of breath. Negative for cough.   Cardiovascular: Positive for chest pain.  Gastrointestinal: Negative for abdominal pain, nausea and vomiting.  Musculoskeletal: Positive for myalgias (LEs @ times).  Neurological: Negative for syncope.  All other systems reviewed and are negative.  Physical Exam Updated Vital Signs BP 140/75 (BP Location: Right Arm)   Pulse (!) 104   Temp 97.9 F (36.6 C) (Oral)   Resp 20   SpO2 94%   Physical Exam Vitals signs and nursing note reviewed.  Constitutional:      General: She is not in acute distress.    Appearance: She is well-developed. She is not toxic-appearing.  HENT:     Head: Normocephalic and atraumatic.  Eyes:     General:        Right eye: No discharge.        Left eye: No discharge.     Conjunctiva/sclera: Conjunctivae normal.  Neck:     Musculoskeletal: Neck supple.  Cardiovascular:     Rate and Rhythm: Regular rhythm. Tachycardia present.     Pulses:           Dorsalis pedis pulses are 2+ on the right side and 2+ on the left side.  Pulmonary:     Effort: Pulmonary effort is normal. No respiratory distress.     Breath sounds: Normal breath sounds. No wheezing, rhonchi or rales.  Chest:     Chest wall: Tenderness present.  Abdominal:     General: There is no distension.     Palpations: Abdomen is soft.     Tenderness:  There is no abdominal tenderness.  Musculoskeletal:     Comments: 1-2 + symmetric pitting edema to the lower legs. No overlying erythema/warmth. No significant calf tenderness.   Skin:    General: Skin is warm and dry.     Findings: No rash.  Neurological:     Mental Status: She is alert.     Comments: Clear speech.   Psychiatric:        Behavior: Behavior normal.    ED Treatments / Results  Labs (all labs ordered are listed, but only abnormal results are displayed) Labs Reviewed  CBC WITH DIFFERENTIAL/PLATELET - Abnormal; Notable for the following components:      Result Value   MCV 102.4 (*)    All other components within normal limits  COMPREHENSIVE METABOLIC PANEL - Abnormal; Notable for the following components:   CO2 21 (*)    Glucose, Bld 232 (*)    Calcium 8.8 (*)    Total Protein 5.6 (*)    Albumin 3.1 (*)    GFR calc non Af Amer 47 (*)    GFR calc Af Amer 55 (*)    All other components within normal limits  BRAIN NATRIURETIC PEPTIDE - Abnormal; Notable for the following components:   B Natriuretic Peptide 198.0 (*)    All other components within normal limits  TROPONIN I (HIGH SENSITIVITY) - Abnormal; Notable for the following components:   Troponin I (High Sensitivity) 20 (*)    All other components within normal limits  TROPONIN I (HIGH SENSITIVITY)    EKG EKG Interpretation  Date/Time:  Tuesday November 07 2018 19:46:17 EDT Ventricular Rate:  105 PR Interval:    QRS Duration: 126 QT Interval:  368 QTC Calculation: 487 R Axis:   87 Text Interpretation:  Sinus tachycardia Multiform ventricular  premature complexes Right bundle branch block Confirmed by Lennice Sites [phone]) on 11/07/2018 7:54:49 PM   Radiology Dg Chest 2 View  Result Date: 11/07/2018 CLINICAL DATA:  Chest pain EXAM: CHEST - 2 VIEW COMPARISON:  May 26, 2017 FINDINGS: Heart size is enlarged. There is persistent elevation of the left hemidiaphragm which is similar to prior study. There is no pneumothorax. No large pleural effusion. There may be some blunting of the costophrenic angles which is similar to prior study. The osseous mineralization is decreased. There is no displaced fracture involving the visualized thoracic spine. IMPRESSION: No active cardiopulmonary disease. Electronically Signed   By: Constance Holster M.D.   On: 11/07/2018 20:48    Procedures Procedures (including critical care time)  Medications Ordered in ED Medications  aspirin chewable tablet 324 mg (has no administration in time range)     Initial Impression / Assessment and Plan / ED Course  I have reviewed the triage vital signs and the nursing notes.  Pertinent labs & imaging results that were available during my care of the patient were reviewed by me and considered in my medical decision making (see chart for details).    Patient presents to the emergency department with chest pain. Patient nontoxic appearing, in no apparent distress, vitals without significant abnormality- mildly tachycardic on arrival. Rhythm strips reviewed from EMS- suspicious for afib w/ RVR, now in sinus tach on the monitor. Some chest wall tenderness & LE edema that is symmetric. DDX: ACS, pulmonary embolism, dissection, pneumothorax, CHF, infiltrate, arrhythmia, anemia, electrolyte derangement, MSK, GERD, anxiety. Evaluation initiated with labs, EKG, and CXR. Patient on cardiac monitor.   Work-up in the ER reviewed:  CBC: No significant  anemia or leukocytosis.  BMP: Hyperglycemia. Mild hypocalcemia. No significant electrolyte derangement.  Troponin: initial  elevated @ 20.  EKG: no STEMI. RBBB. Sinus tach with PVCs.  CXR: Negative, without infiltrate, effusion, pneumothorax, or fracture/dislocation.   Patient's chest pain is somewhat atypical given some reproducibility with palpation, however it is alleviated with rest. She does have exertional dyspnea as well as symmetric LE edema. No prior cardiac catheterization or echocardiogram on record. Remains in sinus in the ED. Feel she could benefit from observation stay with trending of troponins, lasix, & echocardiogram- will further discuss w/ hospitalist service. Findings and plan of care discussed with supervising physician Dr. Ronnald Nian who is in agreement.   22:11: CONSULT: Discussed with hospitalist Dr. Alcario Drought who accepts admission.   Final Clinical Impressions(s) / ED Diagnoses   Final diagnoses:  Chest pain, unspecified type  Dyspnea on exertion    ED Discharge Orders    None       Amaryllis Dyke, PA-C 11/07/18 2322    Lennice Sites, DO 11/08/18 0003

## 2018-11-07 NOTE — ED Notes (Signed)
ED TO INPATIENT HANDOFF REPORT  ED Nurse Name and Phone #: Ticara Waner   S Name/Age/Gender Connie Obrien 85 y.o. female Room/Bed: 017C/017C  Code Status   Code Status: Full Code  Home/SNF/Other Home Patient oriented to: self, place, time and situation Is this baseline? Yes   Triage Complete: Triage complete  Chief Complaint CP, AFIB  Triage Note Pt stated son brought her in to have chest pain checked out. Chest/back pain happening for a few days-week   Allergies Allergies  Allergen Reactions  . Flexeril [Cyclobenzaprine] Other (See Comments)    Hallucinations   . Vicodin [Hydrocodone-Acetaminophen] Other (See Comments)    Hallucinations   . Septra [Bactrim] Rash  . Sulfa Antibiotics Rash    Level of Care/Admitting Diagnosis ED Disposition    ED Disposition Condition [facility]: [place] [100100]  Level of Care: Telemetry Cardiac [103]  I expect the patient will be discharged within 24 hours: No (not a candidate for 5C-Observation unit)  Covid Evaluation: Asymptomatic Screening Protocol (No Symptoms)  Diagnosis: Paroxysmal atrial fibrillation with RVR [facility]) [[id number]]  Admitting Physician: Etta Quill [phone]  Attending Physician: Etta Quill [[id number]]  PT Class (Do Not Modify): Observation [104]  PT Acc Code (Do Not Modify): Observation [10022]       B Medical/Surgery History Past Medical History:  Diagnosis Date  . Allergic rhinitis   . Asthma   . Deaf   . Diverticulosis   . GERD (gastroesophageal reflux disease)   . Glaucoma   . HTN (hypertension)   . IBS (irritable bowel syndrome)   . Obesity   . Osteoarthritis   . Osteopenia   . Scoliosis    Past Surgical History:  Procedure Laterality Date  . ABDOMINAL HERNIA REPAIR    . APPENDECTOMY    . BREAST BIOPSY    . CATARACT EXTRACTION    . COLON RESECTION    . HEMORRHOID SURGERY    . NISSEN FUNDOPLICATION    . TOTAL ABDOMINAL HYSTERECTOMY        A IV Location/Drains/Wounds Patient Lines/Drains/Airways Status   Active Line/Drains/Airways    Name:   Placement date:   Placement time:   Site:   Days:   Peripheral IV 11/07/18 Left Antecubital   11/07/18    2209    Antecubital   less than 1          Intake/Output Last 24 hours No intake or output data in the 24 hours ending 11/07/18 2248  Labs/Imaging Results for orders placed or performed during the hospital encounter of 11/07/18 (from the past 48 hour(s))  CBC with Differential     Status: Abnormal   Collection Time: 11/07/18  8:07 PM  Result Value Ref Range   WBC 8.7 4.0 - 10.5 K/uL   RBC 4.13 3.87 - 5.11 MIL/uL   Hemoglobin 13.4 12.0 - 15.0 g/dL   HCT 42.3 36.0 - 46.0 %   MCV 102.4 (H) 80.0 - 100.0 fL   MCH 32.4 26.0 - 34.0 pg   MCHC 31.7 30.0 - 36.0 g/dL   RDW 13.7 11.5 - 15.5 %   Platelets 198 150 - 400 K/uL   nRBC 0.0 0.0 - 0.2 %   Neutrophils Relative % 64 %   Neutro Abs 5.6 1.7 - 7.7 K/uL   Lymphocytes Relative 27 %   Lymphs Abs 2.4 0.7 - 4.0 K/uL   Monocytes Relative 7 %   Monocytes Absolute 0.6 0.1 -  1.0 K/uL   Eosinophils Relative 1 %   Eosinophils Absolute 0.1 0.0 - 0.5 K/uL   Basophils Relative 1 %   Basophils Absolute 0.0 0.0 - 0.1 K/uL   Immature Granulocytes 0 %   Abs Immature Granulocytes 0.02 0.00 - 0.07 K/uL    Comment: Performed at [facility], [place] [address] [place], [place] [address]  Comprehensive metabolic panel     Status: Abnormal   Collection Time: 11/07/18  8:07 PM  Result Value Ref Range   Sodium 140 135 - 145 mmol/L   Potassium 4.0 3.5 - 5.1 mmol/L   Chloride 107 98 - 111 mmol/L   CO2 21 (L) 22 - 32 mmol/L   Glucose, Bld 232 (H) 70 - 99 mg/dL   BUN 16 8 - 23 mg/dL   Creatinine, Ser 1.00 0.44 - 1.00 mg/dL   Calcium 8.8 (L) 8.9 - 10.3 mg/dL   Total Protein 5.6 (L) 6.5 - 8.1 g/dL   Albumin 3.1 (L) 3.5 - 5.0 g/dL   AST 21 15 - 41 U/L   ALT 13 0 - 44 U/L   Alkaline Phosphatase 62 38 - 126 U/L   Total Bilirubin 0.6 0.3  - 1.2 mg/dL   GFR calc non Af Amer 47 (L) >60 mL/min   GFR calc Af Amer 55 (L) >60 mL/min   Anion gap 12 5 - 15    Comment: Performed at [facility] [address], [place], [place] [address]  Troponin I (High Sensitivity)     Status: Abnormal   Collection Time: 11/07/18  8:07 PM  Result Value Ref Range   Troponin I (High Sensitivity) 20 (H) <18 ng/L    Comment: (NOTE) Elevated high sensitivity troponin I (hsTnI) values and significant  changes across serial measurements may suggest ACS but many other  chronic and acute conditions are known to elevate hsTnI results.  Refer to the "Links" section for chest pain algorithms and additional  guidance. Performed at [facility], [place] [address], [place], [place] [address]   Brain natriuretic peptide     Status: Abnormal   Collection Time: 11/07/18  8:07 PM  Result Value Ref Range   B Natriuretic Peptide 198.0 (H) 0.0 - 100.0 pg/mL    Comment: Performed at [facility] [address], [place], [place] [address]   Dg Chest 2 View  Result Date: 11/07/2018 CLINICAL DATA:  Chest pain EXAM: CHEST - 2 VIEW COMPARISON:  May 26, 2017 FINDINGS: Heart size is enlarged. There is persistent elevation of the left hemidiaphragm which is similar to prior study. There is no pneumothorax. No large pleural effusion. There may be some blunting of the costophrenic angles which is similar to prior study. The osseous mineralization is decreased. There is no displaced fracture involving the visualized thoracic spine. IMPRESSION: No active cardiopulmonary disease. Electronically Signed   By: Constance Holster M.D.   On: 11/07/2018 20:48    Pending Labs Unresulted Labs (From admission, onward)    Start     Ordered   11/08/18 6144  Basic metabolic panel  Daily,   R     11/07/18 2233   11/07/18 2211  SARS CORONAVIRUS 2 Nasal Swab Aptima Multi Swab  (Asymptomatic/Tier 2)  Once,   STAT    Question Answer Comment  Is this test for diagnosis or  screening Screening   Symptomatic for COVID-19 as defined by CDC No   Hospitalized for COVID-19 No  Admitted to ICU for COVID-19 No   Previously tested for COVID-19 No   Resident in a congregate (group) care setting No   Employed in healthcare setting No   Pregnant No      11/07/18 2213          Vitals/Pain Today's Vitals   11/07/18 2000 11/07/18 2015 11/07/18 2217 11/07/18 2226  BP: 117/70 137/72 135/85   Pulse: 100 94 83   Resp: (!) 25 (!) 24 (!) 27   Temp:      TempSrc:      SpO2: 95% 93% 100%   PainSc:    0-No pain    Isolation Precautions No active isolations  Medications Medications  metoprolol tartrate (LOPRESSOR) tablet 12.5 mg (has no administration in time range)  sodium chloride flush (NS) 0.9 % injection 3 mL (3 mLs Intravenous Not Given 11/07/18 2236)  sodium chloride flush (NS) 0.9 % injection 3 mL (has no administration in time range)  0.9 %  sodium chloride infusion (has no administration in time range)  acetaminophen (TYLENOL) tablet 650 mg (has no administration in time range)  ondansetron (ZOFRAN) injection 4 mg (has no administration in time range)  furosemide (LASIX) injection 40 mg (has no administration in time range)  enoxaparin (LOVENOX) injection 40 mg (has no administration in time range)    Mobility walks Moderate fall risk   Focused Assessments Cardiac Assessment Handoff:    No results found for: CKTOTAL, CKMB, CKMBINDEX, TROPONINI No results found for: DDIMER Does the Patient currently have chest pain? No      R Recommendations: See Admitting Provider Note  Report given to:   Additional Notes:

## 2018-11-07 NOTE — H&P (Signed)
History and Physical    Connie Obrien EUM:[id number] DOB: July 16, 1919 DOA: 11/07/2018  PCP: Harlan Stains, MD  Patient coming from: Home  I have personally briefly reviewed patient's old medical records in [place]  Chief Complaint: CP  HPI: Connie Obrien is a 85 y.o. female with medical history significant of HTN, MCI, glaucoma.  Patient presents to the ED with waxing and waning chest pressure in anterior chest radiating to back for the past few days to 1 week.  Pain better with rest.  Associated fatigue.  Has DOE but this ongoing for years.  Has BLE edema for which she chronically takes lasix but isnt sure that this is worse than baseline.  No fever, chills, cough, hemoptysis.  With EMS patient initially in A.Fib RVR with rate 150 (I reviewed strips, does indeed appear to be A.Fib RVR rate 150, I dont see flutter waves).  Patient then spontaneously converted out of A.Fib, initially S.Tach 100-110, now NSR 85 and has remained NSR since arrival to ED.  ED Course: Remains in NSR with a RBBB.  CXR neg except for enlarged heart.  Trop 20  BNP 198    Review of Systems: As per HPI, otherwise all review of systems negative.  Past Medical History:  Diagnosis Date  . Allergic rhinitis   . Asthma   . Deaf   . Diverticulosis   . GERD (gastroesophageal reflux disease)   . Glaucoma   . HTN (hypertension)   . IBS (irritable bowel syndrome)   . Obesity   . Osteoarthritis   . Osteopenia   . Scoliosis     Past Surgical History:  Procedure Laterality Date  . ABDOMINAL HERNIA REPAIR    . APPENDECTOMY    . BREAST BIOPSY    . CATARACT EXTRACTION    . COLON RESECTION    . HEMORRHOID SURGERY    . NISSEN FUNDOPLICATION    . TOTAL ABDOMINAL HYSTERECTOMY       reports that she has never smoked. She has never used smokeless tobacco. She reports that she does not drink alcohol or use drugs.  Allergies  Allergen Reactions  . Septra [Bactrim]   . Sulfa Antibiotics      Family History  Problem Relation Age of Onset  . Coronary artery disease Father   . Diabetes Father   . Coronary artery disease Mother   . Coronary artery disease Brother   . Prostate cancer Brother        x2     Prior to Admission medications   Medication Sig Start Date End Date Taking? Authorizing Provider  aspirin 325 MG EC tablet Take 325 mg by mouth daily.    [provider]  calcium citrate-vitamin D (CITRACAL+D) 315-200 MG-UNIT per tablet Take 1 tablet by mouth 2 (two) times daily.     [provider]  cetirizine (ZYRTEC) 10 MG tablet Take 10 mg by mouth daily.      [provider]  cycloSPORINE (RESTASIS) 0.05 % ophthalmic emulsion Place 1 drop into both eyes 2 (two) times daily.    [provider]  dorzolamide-timolol (COSOPT) 22.3-6.8 MG/ML ophthalmic solution Place 1 drop into both eyes 2 (two) times daily.    [provider]  estradiol (ESTRACE) 0.1 MG/GM vaginal cream Place 2 g vaginally as needed.     [provider]  furosemide (LASIX) 40 MG tablet Take 40 mg by mouth Daily.  04/21/11   [provider]  losartan (COZAAR) 100  MG tablet Take 100 mg by mouth daily.  08/07/11   [provider]  Nutritional Supplements (PRO-FLORA PLUS) CAPS Take 1 capsule by mouth daily. 10/02/10   Parrett, Fonnie Mu, NP  OLANZapine (ZYPREXA) 2.5 MG tablet Take 1 tablet (2.5 mg total) by mouth at bedtime. 12/31/16   Cameron Sprang, MD  pravastatin (PRAVACHOL) 40 MG tablet Take 1 tablet (40 mg total) by mouth at bedtime. 10/02/10   Parrett, Fonnie Mu, NP  SYMBICORT 160-4.5 MCG/ACT inhaler INHALE ONE PUFF INTO LUNGS TWICE DAILY 08/30/14   Brand Males, MD  VENTOLIN HFA 108 (90 BASE) MCG/ACT inhaler Inhale 1 puff into the lungs Every 6 hours as needed for wheezing or shortness of breath.  04/26/11   [provider]  vitamin B-12 (CYANOCOBALAMIN) 500 MCG tablet Take 500 mcg by mouth daily.    [provider]   vitamin D, CHOLECALCIFEROL, 400 UNITS tablet Take 400 Units by mouth daily.      [provider]  vitamin E 400 UNIT capsule Take 400 Units by mouth daily.      [provider]  beclomethasone (QVAR) 80 MCG/ACT inhaler Inhale 2 puffs into the lungs 2 (two) times daily. 09/29/10 05/31/11  Parrett, Fonnie Mu, NP    Physical Exam: Vitals:   11/07/18 1947 11/07/18 2000 11/07/18 2015 11/07/18 2217  BP: 140/75 117/70 137/72 135/85  Pulse: (!) 104 100 94 83  Resp: 20 (!) 25 (!) 24 (!) 27  Temp: 97.9 F (36.6 C)     TempSrc: Oral     SpO2: 94% 95% 93% 100%    Constitutional: NAD, calm, comfortable Eyes: PERRL, lids and conjunctivae normal ENMT: Mucous membranes are moist. Posterior pharynx clear of any exudate or lesions.Normal dentition.  Despite being listed as "Deaf" in [facility], seems to hear me okay and answering questions appropriately. Neck: normal, supple, no masses, no thyromegaly Respiratory: Coarse breath sounds, normal respiratory effort and no accessory muscle use Cardiovascular: Regular rate and rhythm, no murmurs / rubs / gallops. 1+ BLE edema. 2+ pedal pulses. No carotid bruits.  Abdomen: no tenderness, no masses palpated. No hepatosplenomegaly. Bowel sounds positive.  Musculoskeletal: no clubbing / cyanosis. No joint deformity upper and lower extremities. Good ROM, no contractures. Normal muscle tone.  Skin: no rashes, lesions, ulcers. No induration Neurologic: CN 2-12 grossly intact. Sensation intact, DTR normal. Strength 5/5 in all 4.  Psychiatric: Normal judgment and insight. Alert and oriented x 3. Normal mood.    Labs on Admission: I have personally reviewed following labs and imaging studies  CBC: Recent Labs  Lab 11/07/18 2007  WBC 8.7  NEUTROABS 5.6  HGB 13.4  HCT 42.3  MCV 102.4*  PLT 916   Basic Metabolic Panel: Recent Labs  Lab 11/07/18 2007  NA 140  K 4.0  CL 107  CO2 21*  GLUCOSE 232*  BUN 16  CREATININE 1.00  CALCIUM 8.8*   GFR:  CrCl cannot be calculated (Unknown ideal weight.). Liver Function Tests: Recent Labs  Lab 11/07/18 2007  AST 21  ALT 13  ALKPHOS 62  BILITOT 0.6  PROT 5.6*  ALBUMIN 3.1*   No results for input(s): LIPASE, AMYLASE in the last 168 hours. No results for input(s): AMMONIA in the last 168 hours. Coagulation Profile: No results for input(s): INR, PROTIME in the last 168 hours. Cardiac Enzymes: No results for input(s): CKTOTAL, CKMB, CKMBINDEX, TROPONINI in the last 168 hours. BNP (last 3 results) No results for input(s): PROBNP in the last  8760 hours. HbA1C: No results for input(s): HGBA1C in the last 72 hours. CBG: No results for input(s): GLUCAP in the last 168 hours. Lipid Profile: No results for input(s): CHOL, HDL, LDLCALC, TRIG, CHOLHDL, LDLDIRECT in the last 72 hours. Thyroid Function Tests: No results for input(s): TSH, T4TOTAL, FREET4, T3FREE, THYROIDAB in the last 72 hours. Anemia Panel: No results for input(s): VITAMINB12, FOLATE, FERRITIN, TIBC, IRON, RETICCTPCT in the last 72 hours. Urine analysis: No results found for: COLORURINE, APPEARANCEUR, [place], [place], [place], [place], [place], KETONESUR, PROTEINUR, [place], NITRITE, LEUKOCYTESUR  Radiological Exams on Admission: Dg Chest 2 View  Result Date: 11/07/2018 CLINICAL DATA:  Chest pain EXAM: CHEST - 2 VIEW COMPARISON:  May 26, 2017 FINDINGS: Heart size is enlarged. There is persistent elevation of the left hemidiaphragm which is similar to prior study. There is no pneumothorax. No large pleural effusion. There may be some blunting of the costophrenic angles which is similar to prior study. The osseous mineralization is decreased. There is no displaced fracture involving the visualized thoracic spine. IMPRESSION: No active cardiopulmonary disease. Electronically Signed   By: Constance Holster M.D.   On: 11/07/2018 20:48    EKG: Independently reviewed.  Assessment/Plan Principal Problem:   Acute  diastolic CHF (congestive heart failure) (HCC) Active Problems:   Paroxysmal atrial fibrillation with RVR (HCC)    1. Acute diastolic CHF - suspect HR related CHF given the PAF with RVR rate 150 as seen on EMS strips 1. Serial trops 2. 40mg  lasix IV x1 3. Then resume home lasix tomorrow 4. Strict intake and output 5. Tele monitor 6. 2d echo 2. PAF with RVR - new problem 1. Spontaneously converted to NSR in ambulance and has remained there thus far 2. Tele monitor 3. Start metoprolol 12.5mg  PO BID 4. 2d echo 5. Holding off on anticoagulation given patients very advanced age 42. If she goes back into A.Fib RVR during stay then we can judge effectiveness of metoprolol and adjust med, obviously this will be more difficult if she remains in NSR overnight. 7. May want to give cards a call in AM for their opinion. 3. RBBB - 1. Dr. Eulis Foster did note that RBBB was present in his 04/23/2011 EDP note, and he states that that EKG was unchanged compared to 2000.  Thus I judge the RBBB to likely be very chronic.  DVT prophylaxis: Lovenox Code Status: Full Family Communication: Son at bedside Disposition Plan: Home after admit Consults called: None Admission status: Place in [place]    [place], [place] [facility]  How to contact the [facility] Attending or Consulting provider [place] or covering provider during after hours [place], for this patient?  1. Check the care team in [facility] and look for a) attending/consulting [facility] provider listed and b) the [facility] team listed 2. Log into [email or web address]  Amion Physician Scheduling and messaging for groups and whole hospitals  On call and physician scheduling software for group practices, residents, hospitalists and other medical providers for call, clinic, rotation and shift schedules. OnCall Enterprise is a hospital-wide system for scheduling doctors and paging doctors on call. EasyPlot is for scientific plotting and data analysis.  [email or web address]  and use [facility]'s universal password to access. If you do not have the password, please contact the hospital operator.  3. Locate the [facility] provider you are looking for under [facility] and page to a number that you can be directly reached. 4. If you still have difficulty reaching the provider, please  page the [facility] (Director on Call) for the Hospitalists listed on amion for assistance.  11/07/2018, 10:36 PM

## 2018-11-08 ENCOUNTER — Other Ambulatory Visit: Payer: Self-pay

## 2018-11-08 ENCOUNTER — Observation Stay (HOSPITAL_BASED_OUTPATIENT_CLINIC_OR_DEPARTMENT_OTHER): Payer: Medicare Other

## 2018-11-08 DIAGNOSIS — I361 Nonrheumatic tricuspid (valve) insufficiency: Secondary | ICD-10-CM

## 2018-11-08 DIAGNOSIS — R079 Chest pain, unspecified: Secondary | ICD-10-CM | POA: Diagnosis present

## 2018-11-08 DIAGNOSIS — I340 Nonrheumatic mitral (valve) insufficiency: Secondary | ICD-10-CM

## 2018-11-08 DIAGNOSIS — I5031 Acute diastolic (congestive) heart failure: Secondary | ICD-10-CM | POA: Diagnosis not present

## 2018-11-08 DIAGNOSIS — I4891 Unspecified atrial fibrillation: Secondary | ICD-10-CM

## 2018-11-08 LAB — BASIC METABOLIC PANEL
Anion gap: 8 (ref 5–15)
BUN: 15 mg/dL (ref 8–23)
CO2: 25 mmol/L (ref 22–32)
Calcium: 9 mg/dL (ref 8.9–10.3)
Chloride: 109 mmol/L (ref 98–111)
Creatinine, Ser: 0.92 mg/dL (ref 0.44–1.00)
GFR calc Af Amer: >60 mL/min (ref >60)
GFR calc non Af Amer: 52 mL/min — ABNORMAL LOW (ref >60)
Glucose, Bld: 127 mg/dL — ABNORMAL HIGH (ref 70–99)
Potassium: 4.1 mmol/L (ref 3.5–5.1)
Sodium: 142 mmol/L (ref 135–145)

## 2018-11-08 LAB — GLUCOSE, CAPILLARY: Glucose-Capillary: 145 mg/dL — ABNORMAL HIGH (ref 70–99)

## 2018-11-08 LAB — ECHOCARDIOGRAM COMPLETE
Height: 61 in
Weight: 2820.8 oz

## 2018-11-08 LAB — SARS CORONAVIRUS 2 (TAT 6-24 HRS): SARS Coronavirus 2: NEGATIVE

## 2018-11-08 LAB — TROPONIN I (HIGH SENSITIVITY): Troponin I (High Sensitivity): 45 ng/L — ABNORMAL HIGH (ref <18)

## 2018-11-08 MED ORDER — LORATADINE 10 MG PO TABS
10.0000 mg | ORAL_TABLET | Freq: Every day | ORAL | Status: DC
  Administered 2018-11-08: 10:00:00 10 mg via ORAL
  Filled 2018-11-08: qty 1

## 2018-11-08 MED ORDER — ACETAMINOPHEN 500 MG PO TABS
500.0000 mg | ORAL_TABLET | Freq: Three times a day (TID) | ORAL | Status: DC
  Administered 2018-11-08: 500 mg via ORAL
  Filled 2018-11-08: qty 1

## 2018-11-08 MED ORDER — MOMETASONE FURO-FORMOTEROL FUM 200-5 MCG/ACT IN AERO
2.0000 | INHALATION_SPRAY | Freq: Two times a day (BID) | RESPIRATORY_TRACT | Status: DC
  Administered 2018-11-08: 2 via RESPIRATORY_TRACT
  Filled 2018-11-08: qty 8.8

## 2018-11-08 MED ORDER — VITAMIN E 180 MG (400 UNIT) PO CAPS
400.0000 [IU] | ORAL_CAPSULE | Freq: Every day | ORAL | Status: DC
  Administered 2018-11-08: 400 [IU] via ORAL
  Filled 2018-11-08: qty 1

## 2018-11-08 MED ORDER — PANTOPRAZOLE SODIUM 40 MG PO TBEC
80.0000 mg | DELAYED_RELEASE_TABLET | Freq: Every day | ORAL | Status: DC
  Administered 2018-11-08: 80 mg via ORAL
  Filled 2018-11-08: qty 2

## 2018-11-08 MED ORDER — PNEUMOCOCCAL VAC POLYVALENT 25 MCG/0.5ML IJ INJ: 0.5000 mL | INJECTION | INTRAMUSCULAR | Status: DC

## 2018-11-08 MED ORDER — POTASSIUM CHLORIDE CRYS ER 20 MEQ PO TBCR
20.0000 meq | EXTENDED_RELEASE_TABLET | Freq: Every day | ORAL | Status: DC
  Administered 2018-11-08: 10:00:00 20 meq via ORAL
  Filled 2018-11-08: qty 1

## 2018-11-08 MED ORDER — CALCIUM CITRATE-VITAMIN D 500-400 MG-UNIT PO CHEW
1.0000 | CHEWABLE_TABLET | Freq: Two times a day (BID) | ORAL | Status: DC
  Filled 2018-11-08: qty 1

## 2018-11-08 MED ORDER — DORZOLAMIDE HCL-TIMOLOL MAL 2-0.5 % OP SOLN
1.0000 [drp] | Freq: Two times a day (BID) | OPHTHALMIC | Status: DC
  Administered 2018-11-08: 1 [drp] via OPHTHALMIC
  Filled 2018-11-08: qty 10

## 2018-11-08 MED ORDER — ASPIRIN EC 81 MG PO TBEC
162.0000 mg | DELAYED_RELEASE_TABLET | Freq: Every day | ORAL | Status: DC
  Administered 2018-11-08: 162 mg via ORAL
  Filled 2018-11-08: qty 2

## 2018-11-08 MED ORDER — METOPROLOL SUCCINATE ER 25 MG PO TB24: 25.0000 mg | ORAL_TABLET | Freq: Every day | ORAL | 0 refills | Status: DC

## 2018-11-08 MED ORDER — METOPROLOL TARTRATE 25 MG PO TABS: 37.5000 mg | ORAL_TABLET | Freq: Two times a day (BID) | ORAL | Status: DC

## 2018-11-08 MED ORDER — FLUTICASONE PROPIONATE 50 MCG/ACT NA SUSP
2.0000 | Freq: Every day | NASAL | Status: DC | PRN
  Filled 2018-11-08: qty 16

## 2018-11-08 MED ORDER — ALBUTEROL SULFATE (2.5 MG/3ML) 0.083% IN NEBU: 3.0000 mL | INHALATION_SOLUTION | RESPIRATORY_TRACT | Status: DC

## 2018-11-08 MED ORDER — FUROSEMIDE 20 MG PO TABS
20.0000 mg | ORAL_TABLET | Freq: Every day | ORAL | Status: DC
  Administered 2018-11-08: 10:00:00 20 mg via ORAL
  Filled 2018-11-08: qty 1

## 2018-11-08 MED ORDER — METOPROLOL TARTRATE 25 MG PO TABS
25.0000 mg | ORAL_TABLET | Freq: Two times a day (BID) | ORAL | Status: DC
  Administered 2018-11-08: 10:00:00 25 mg via ORAL
  Filled 2018-11-08: qty 1

## 2018-11-08 MED ORDER — VITAMIN C 500 MG PO TABS
500.0000 mg | ORAL_TABLET | Freq: Every day | ORAL | Status: DC
  Administered 2018-11-08: 500 mg via ORAL
  Filled 2018-11-08: qty 1

## 2018-11-08 MED ORDER — CALCIUM CARBONATE-VITAMIN D 500-200 MG-UNIT PO TABS
1.0000 | ORAL_TABLET | Freq: Two times a day (BID) | ORAL | Status: DC
  Administered 2018-11-08: 1 via ORAL
  Filled 2018-11-08: qty 1

## 2018-11-08 MED ORDER — CYANOCOBALAMIN 500 MCG PO TABS
500.0000 ug | ORAL_TABLET | Freq: Every day | ORAL | Status: DC
  Administered 2018-11-08: 500 ug via ORAL
  Filled 2018-11-08: qty 1

## 2018-11-08 NOTE — Discharge Summary (Signed)
Physician Discharge Summary  Connie Obrien:[id number] DOB: Apr 13, 1919 DOA: 11/07/2018  PCP: Harlan Stains, MD  Admit date: 11/07/2018 Discharge date: 11/08/2018  Admitted From: Home Disposition: Home  Recommendations for Outpatient Follow-up:  1. Follow up with PCP in 1-2 weeks 2. Please obtain BMP/CBC in one week 3. Please follow up on the following pending results:  Home Health: None Equipment/Devices: None  Discharge Condition: Stable CODE STATUS: Full code Diet recommendation: Cardiac  Subjective: Patient seen and examined.  She has no complaints.  No chest pain or shortness of breath.  Son at the bedside.  Brief/Interim Summary: Connie Obrien is a 85 y.o. female with medical history significant of HTN, MCI, glaucoma who presented to the ED with waxing and waning chest pressure in anterior chest radiating to back for the past few days to 1 week.  Pain better with rest.  Associated fatigue.  Has DOE but this ongoing for years.  Has BLE edema for which she chronically takes lasix but isnt sure that this is worse than baseline.No fever, chills, cough, hemoptysis. With EMS patient was initially in A.Fib RVR with rate 150. Patient then spontaneously converted out of A.Fib, initially S.Tach 100-110 followed by NSR 85 and remained in normal sinus rhythm throughout ED visit.  Chest x-ray and rest of the basic lab work was all normal.  She was admitted to hospital service for chest pain rule out MI.  Cardiac enzymes were checked which were very minimally elevated.  Patient remained in normal sinus rhythm throughout her short hospitalization stay.  When seen this morning, she had no more chest pain.  Son was at the bedside.  I did discuss with the son in great length that patient may or may never develop another episode of atrial fibrillation and that if she does then she might be a candidate for anticoagulation however that will be high risk for patient but this was 49 year old.  Son was very  understanding and completely verbalized all the plan.  We decided not to put her on any anticoagulation at this point in time since we do not have any evidence of recurrence of atrial fibrillation.  Son was agreeable with that plan.  She was also seen by cardiology and cardiology also agreed with that plan.  Of note.  Patient was also thought to be having acute on chronic diastolic congestive heart failure for which she received 1 dose of IV Lasix however patient was never hypoxic, chest x-ray does not show pulmonary edema and her BNP was at her baseline.  She will resume her home dose of Lasix.  Patient is being discharged in stable condition.  Echo done which does not show any acute systolic congestive heart failure.   Discharge Diagnoses:  Principal Problem:   Acute diastolic CHF (congestive heart failure) (HCC) Active Problems:   Paroxysmal atrial fibrillation with RVR (HCC)   RBBB   Chest pain on exertion   Discharge Instructions  Discharge Instructions    Discharge patient   Complete by: As directed    Discharge disposition: 01-Home or Self Care   Discharge patient date: 11/08/2018     Allergies as of 11/08/2018      Reactions   Flexeril [cyclobenzaprine] Other (See Comments)   Hallucinations   Vicodin [hydrocodone-acetaminophen] Other (See Comments)   Hallucinations   Septra [bactrim] Rash   Sulfa Antibiotics Rash      Medication List    TAKE these medications   acetaminophen 500 MG tablet Commonly known as:  TYLENOL Take 500 mg by mouth 3 (three) times daily.   aspirin EC 81 MG tablet Take 162 mg by mouth daily.   augmented betamethasone dipropionate 0.05 % cream Commonly known as: DIPROLENE-AF 1 application See admin instructions. Apply to affected area daily 3-4 times a week as needed/as directed (vaginal area)   calcium citrate-vitamin D 315-200 MG-UNIT tablet Commonly known as: CITRACAL+D Take 1 tablet by mouth 2 (two) times daily.   cetirizine 10 MG  tablet Commonly known as: ZYRTEC Take 10 mg by mouth daily.   clobetasol 0.05 % external solution Commonly known as: TEMOVATE Apply 1 application topically 2 (two) times a week.   Dexilant 60 MG capsule Generic drug: dexlansoprazole Take 60 mg by mouth daily.   dorzolamide-timolol 22.3-6.8 MG/ML ophthalmic solution Commonly known as: COSOPT Place 1 drop into both eyes 2 (two) times daily.   fluticasone 50 MCG/ACT nasal spray Commonly known as: FLONASE Place 2 sprays into both nostrils daily as needed for allergies or rhinitis.   furosemide 20 MG tablet Commonly known as: LASIX Take 20 mg by mouth See admin instructions. Take 20 mg by mouth in the morning and an additional 20 mg once daily as needed for fluid or edema   metoprolol succinate 25 MG 24 hr tablet Commonly known as: TOPROL-XL Take 1 tablet (25 mg total) by mouth daily. What changed: how much to take   mupirocin ointment 2 % Commonly known as: BACTROBAN Place 1 application into the nose 2 (two) times daily as needed (for sore in nose).   potassium chloride SA 20 MEQ tablet Commonly known as: K-DUR Take 20 mEq by mouth daily.   REFRESH OP Place 1 drop into both eyes 3 (three) times daily as needed (for dryness).   Symbicort 160-4.5 MCG/ACT inhaler Generic drug: budesonide-formoterol INHALE ONE PUFF INTO LUNGS TWICE DAILY What changed: See the new instructions.   triamcinolone cream 0.5 % Commonly known as: KENALOG Apply 1 application topically See admin instructions. Apply a pea-sized amount to affected area twice weekly as needed/as directed   Ventolin HFA 108 (90 Base) MCG/ACT inhaler Generic drug: albuterol Inhale 2 puffs into the lungs See admin instructions. Inhale 2 puffs into the lungs every 4-6 hours as needed for wheezing or shortness of breath   vitamin B-12 500 MCG tablet Commonly known as: CYANOCOBALAMIN Take 500 mcg by mouth daily.   vitamin C 500 MG tablet Commonly known as: ASCORBIC  ACID Take 500 mg by mouth daily.   Vitamin D-3 25 MCG (1000 UT) Caps Take 1,000 Units by mouth daily.   vitamin E 400 UNIT capsule Take 400 Units by mouth daily.      Follow-up Information    Harlan Stains, MD Follow up in 1 week(s).   Specialty: Family Medicine Contact information: [address], [address] [place] [address] [phone]          Allergies  Allergen Reactions  . Flexeril [Cyclobenzaprine] Other (See Comments)    Hallucinations   . Vicodin [Hydrocodone-Acetaminophen] Other (See Comments)    Hallucinations   . Septra [Bactrim] Rash  . Sulfa Antibiotics Rash    Consultations: Cardiology   Procedures/Studies: Dg Chest 2 View  Result Date: 11/07/2018 CLINICAL DATA:  Chest pain EXAM: CHEST - 2 VIEW COMPARISON:  May 26, 2017 FINDINGS: Heart size is enlarged. There is persistent elevation of the left hemidiaphragm which is similar to prior study. There is no pneumothorax. No large pleural effusion. There may be some blunting of  the costophrenic angles which is similar to prior study. The osseous mineralization is decreased. There is no displaced fracture involving the visualized thoracic spine. IMPRESSION: No active cardiopulmonary disease. Electronically Signed   By: Constance Holster M.D.   On: 11/07/2018 20:48     Discharge Exam: Vitals:   11/08/18 0854 11/08/18 1120  BP:  (!) 159/84  Pulse: 83 79  Resp: 18   Temp:  97.7 F (36.5 C)  SpO2: 95% 96%   Vitals:   11/08/18 0714 11/08/18 0821 11/08/18 0854 11/08/18 1120  BP: (!) 166/89 (!) 175/78  (!) 159/84  Pulse: 90 97 83 79  Resp: (!) 22 20 18    Temp: (!) 97.4 F (36.3 C) 97.9 F (36.6 C)  97.7 F (36.5 C)  TempSrc: Oral Oral  Oral  SpO2: 95% 95% 95% 96%  Weight: 80 kg     Height:        General: Pt is alert, awake, not in acute distress Cardiovascular: RRR, S1/S2 +, no rubs, no gallops Respiratory: CTA bilaterally, no wheezing, no rhonchi Abdominal: Soft, NT, ND, bowel  sounds + Extremities: no edema, no cyanosis    The results of significant diagnostics from this hospitalization (including imaging, microbiology, ancillary and laboratory) are listed below for reference.     Microbiology: Recent Results (from the past 240 hour(s))  SARS CORONAVIRUS 2 Nasal Swab Aptima Multi Swab     Status: None   Collection Time: 11/07/18 10:11 PM   Specimen: Aptima Multi Swab; Nasal Swab  Result Value Ref Range Status   SARS Coronavirus 2 NEGATIVE NEGATIVE Final    Comment: (NOTE) SARS-CoV-2 target nucleic acids are NOT DETECTED. The SARS-CoV-2 RNA is generally detectable in upper and lower respiratory specimens during the acute phase of infection. Negative results do not preclude SARS-CoV-2 infection, do not rule out co-infections with other pathogens, and should not be used as the sole basis for treatment or other patient management decisions. Negative results must be combined with clinical observations, patient history, and epidemiological information. The expected result is Negative. Fact Sheet for Patients: SugarRoll.be Fact Sheet for Healthcare Providers: https://www.woods-mathews.com/ This test is not yet approved or cleared by the [place] FDA and  has been authorized for detection and/or diagnosis of SARS-CoV-2 by FDA under an Emergency Use Authorization (EUA). This EUA will remain  in effect (meaning this test can be used) for the duration of the COVID-19 declaration under Section 56 4(b)(1) of the Act, 21 U.S.C. section 360bbb-3(b)(1), unless the authorization is terminated or revoked sooner. Performed at [facility], [place] [address], [place], [place] [address]      Labs: BNP (last 3 results) Recent Labs    11/07/18 2007  BNP 024.0*   Basic Metabolic Panel: Recent Labs  Lab 11/07/18 2007 11/08/18 0014  NA 140 142  K 4.0 4.1  CL 107 109  CO2 21* 25  GLUCOSE 232* 127*  BUN 16 15   CREATININE 1.00 0.92  CALCIUM 8.8* 9.0   Liver Function Tests: Recent Labs  Lab 11/07/18 2007  AST 21  ALT 13  ALKPHOS 62  BILITOT 0.6  PROT 5.6*  ALBUMIN 3.1*   No results for input(s): LIPASE, AMYLASE in the last 168 hours. No results for input(s): AMMONIA in the last 168 hours. CBC: Recent Labs  Lab 11/07/18 2007  WBC 8.7  NEUTROABS 5.6  HGB 13.4  HCT 42.3  MCV 102.4*  PLT 198   Cardiac Enzymes: No results for input(s): CKTOTAL,  CKMB, CKMBINDEX, TROPONINI in the last 168 hours. BNP: Invalid input(s): POCBNP CBG: Recent Labs  Lab 11/08/18 0814  GLUCAP 145*   D-Dimer No results for input(s): DDIMER in the last 72 hours. Hgb A1c No results for input(s): HGBA1C in the last 72 hours. Lipid Profile No results for input(s): CHOL, HDL, LDLCALC, TRIG, CHOLHDL, LDLDIRECT in the last 72 hours. Thyroid function studies No results for input(s): TSH, T4TOTAL, T3FREE, THYROIDAB in the last 72 hours.  Invalid input(s): FREET3 Anemia work up No results for input(s): VITAMINB12, FOLATE, FERRITIN, TIBC, IRON, RETICCTPCT in the last 72 hours. Urinalysis No results found for: COLORURINE, APPEARANCEUR, [place], [place], [place], [place], [place], [place], PROTEINUR, UROBILINOGEN, NITRITE, LEUKOCYTESUR Sepsis Labs Invalid input(s): PROCALCITONIN,  WBC,  LACTICIDVEN Microbiology Recent Results (from the past 240 hour(s))  SARS CORONAVIRUS 2 Nasal Swab Aptima Multi Swab     Status: None   Collection Time: 11/07/18 10:11 PM   Specimen: Aptima Multi Swab; Nasal Swab  Result Value Ref Range Status   SARS Coronavirus 2 NEGATIVE NEGATIVE Final    Comment: (NOTE) SARS-CoV-2 target nucleic acids are NOT DETECTED. The SARS-CoV-2 RNA is generally detectable in upper and lower respiratory specimens during the acute phase of infection. Negative results do not preclude SARS-CoV-2 infection, do not rule out co-infections with other pathogens, and should not be used as the sole  basis for treatment or other patient management decisions. Negative results must be combined with clinical observations, patient history, and epidemiological information. The expected result is Negative. Fact Sheet for Patients: SugarRoll.be Fact Sheet for Healthcare Providers: https://www.woods-mathews.com/ This test is not yet approved or cleared by the [place] FDA and  has been authorized for detection and/or diagnosis of SARS-CoV-2 by FDA under an Emergency Use Authorization (EUA). This EUA will remain  in effect (meaning this test can be used) for the duration of the COVID-19 declaration under Section 56 4(b)(1) of the Act, 21 U.S.C. section 360bbb-3(b)(1), unless the authorization is terminated or revoked sooner. Performed at [facility], [place] [address], [place], [place] [address]      Time coordinating discharge: Over 30 minutes  SIGNED:   Darliss Cheney, MD  [facility] 11/08/2018, 1:14 PM Pager [phone]  If 7PM-7AM, please contact night-coverage [email or web address] Password [email or web address]

## 2018-11-08 NOTE — Discharge Instructions (Signed)
Atrial Fibrillation ° °Atrial fibrillation is a type of heartbeat that is irregular or fast (rapid). If you have this condition, your heart beats without any order. This makes it hard for your heart to pump blood in a normal way. Having this condition gives you more risk for stroke, heart failure, and other heart problems. °Atrial fibrillation may start all of a sudden and then stop on its own, or it may become a long-lasting problem. °What are the causes? °This condition may be caused by heart conditions, such as: °· High blood pressure. °· Heart failure. °· Heart valve disease. °· Heart surgery. °Other causes include: °· Pneumonia. °· Obstructive sleep apnea. °· Lung cancer. °· Thyroid disease. °· Drinking too much alcohol. °Sometimes the cause is not known. °What increases the risk? °You are more likely to develop this condition if: °· You smoke. °· You are older. °· You have diabetes. °· You are overweight. °· You have a family history of this condition. °· You exercise often and hard. °What are the signs or symptoms? °Common symptoms of this condition include: °· A feeling like your heart is beating very fast. °· Chest pain. °· Feeling short of breath. °· Feeling light-headed or weak. °· Getting tired easily. °Follow these instructions at home: °Medicines °· Take over-the-counter and prescription medicines only as told by your doctor. °· If your doctor gives you a blood-thinning medicine, take it exactly as told. Taking too much of it can cause bleeding. Taking too little of it does not protect you against clots. Clots can cause a stroke. °Lifestyle ° °  ° °· Do not use any tobacco products. These include cigarettes, chewing tobacco, and e-cigarettes. If you need help quitting, ask your doctor. °· Do not drink alcohol. °· Do not drink beverages that have caffeine. These include coffee, soda, and tea. °· Follow diet instructions as told by your doctor. °· Exercise regularly as told by your doctor. °General  instructions °· If you have a condition that causes breathing to stop for a short period of time (apnea), treat it as told by your doctor. °· Keep a healthy weight. Do not use diet pills unless your doctor says they are safe for you. Diet pills may make heart problems worse. °· Keep all follow-up visits as told by your doctor. This is important. °Contact a doctor if: °· You notice a change in the speed, rhythm, or strength of your heartbeat. °· You are taking a blood-thinning medicine and you see more bruising. °· You get tired more easily when you move or exercise. °· You have a sudden change in weight. °Get help right away if: ° °· You have pain in your chest or your belly (abdomen). °· You have trouble breathing. °· You have blood in your vomit, poop, or pee (urine). °· You have any signs of a stroke. "BE FAST" is an easy way to remember the main warning signs: °? B - Balance. Signs are dizziness, sudden trouble walking, or loss of balance. °? E - Eyes. Signs are trouble seeing or a change in how you see. °? F - Face. Signs are sudden weakness or loss of feeling in the face, or the face or eyelid drooping on one side. °? A - Arms. Signs are weakness or loss of feeling in an arm. This happens suddenly and usually on one side of the body. °? S - Speech. Signs are sudden trouble speaking, slurred speech, or trouble understanding what people say. °? T - Time.   Time to call emergency services. Write down what time symptoms started. °· You have other signs of a stroke, such as: °? A sudden, very bad headache with no known cause. °? Feeling sick to your stomach (nausea). °? Throwing up (vomiting). °? Jerky movements you cannot control (seizure). °These symptoms may be an emergency. Do not wait to see if the symptoms will go away. Get medical help right away. Call your local emergency services (911 in the [place] Do not drive yourself to the hospital. °Summary °· Atrial fibrillation is a type of heartbeat that is irregular  or fast (rapid). °· You are at higher risk of this condition if you smoke, are older, have diabetes, or are overweight. °· Follow your doctor's instructions about medicines, diet, exercise, and follow-up visits. °· Get help right away if you think that you have signs of a stroke. °This information is not intended to replace advice given to you by your health care provider. Make sure you discuss any questions you have with your health care provider. °Document Released: 12/16/2007 Document Revised: 05/12/2017 Document Reviewed: 04/29/2017 °Elsevier Patient Education © 2020 [employer] ° °

## 2018-11-08 NOTE — Progress Notes (Signed)
Looks like she already takes metoprolol 12.5mg  BID.  Will up the dose to 25mg  BID.

## 2018-11-08 NOTE — Evaluation (Signed)
Physical Therapy Evaluation Patient Details Name: Connie Obrien MRN: [id number] DOB: [birth year] Today's Date: 11/08/2018   History of Present Illness  Pt is a 85 y/o female admitted secondary to chest pain. Found to be in a fib with RVR and with dCHF. PMH includes dementia, blindness, HTN, GERD, and asthma.   Clinical Impression  Pt admitted secondary to problem above with deficits below. Pt's son present in room and assisted with giving history and PLOF information. Pt requiring min A for mobility tasks using RW. Pt with cognitive deficits and difficulty sequencing which pt's son reports as baseline. Pt also required directional cues secondary to blindness. Pt's son reports family can assist with mobility tasks and is eager to d/c home. Will continue to follow acutely to maximize functional mobility independence and safety.     Follow Up Recommendations Home health PT;Supervision/Assistance - 24 hour    Equipment Recommendations  None recommended by PT    Recommendations for Other Services       Precautions / Restrictions Precautions Precautions: Fall Restrictions Weight Bearing Restrictions: No      Mobility  Bed Mobility Overal bed mobility: Needs Assistance Bed Mobility: Supine to Sit;Sit to Supine     Supine to sit: Min assist Sit to supine: Min assist   General bed mobility comments: Min A for sequencing and LE/UE assist to perform bed mobility. Pt requiring multimodal cues and directional cues for safety throughout.   Transfers Overall transfer level: Needs assistance Equipment used: Rolling walker (2 wheeled) Transfers: Sit to/from Stand Sit to Stand: Min guard         General transfer comment: Min guard for safety. Cues for safe hand placement.   Ambulation/Gait Ambulation/Gait assistance: Min assist Gait Distance (Feet): 20 Feet Assistive device: Rolling walker (2 wheeled) Gait Pattern/deviations: Step-through pattern;Decreased stride length;Trunk  flexed;Drifts right/left Gait velocity: Decreased   General Gait Details: Slow, unsteady gait. Noted difficulty sequencing with RW and required multimodal cues for safe use. ALso required directional cues given blindness. Educated son that pt will likely require more hands on assist and pt's son agreeable and reports they can assist.   Stairs            Wheelchair Mobility    Modified Rankin (Stroke Patients Only)       Balance Overall balance assessment: Needs assistance Sitting-balance support: No upper extremity supported;Feet supported Sitting balance-Leahy Scale: Good     Standing balance support: Bilateral upper extremity supported;During functional activity Standing balance-Leahy Scale: Poor Standing balance comment: Reliant on BUE support                              Pertinent Vitals/Pain Pain Assessment: Faces Faces Pain Scale: Hurts little more Pain Location: back Pain Descriptors / Indicators: Aching;Operative site guarding Pain Intervention(s): Monitored during session;Limited activity within patient's tolerance;Repositioned    Home Living Family/patient expects to be discharged to:: Private residence Living Arrangements: Children Available Help at Discharge: Family;Available 24 hours/day Type of Home: House Home Access: Level entry     Home Layout: One level Home Equipment: Walker - 4 wheels;Tub bench      Prior Function Level of Independence: Needs assistance   Gait / Transfers Assistance Needed: Son reports pt just requires supervision for mobility with rollator.   ADL's / Homemaking Assistance Needed: Son reports pt requires assist with all ADLs. Uses tub bench for transfers into/out of tub.  Hand Dominance        Extremity/Trunk Assessment   Upper Extremity Assessment Upper Extremity Assessment: Generalized weakness    Lower Extremity Assessment Lower Extremity Assessment: Generalized weakness    Cervical /  Trunk Assessment Cervical / Trunk Assessment: Kyphotic  Communication   Communication: HOH  Cognition Arousal/Alertness: Awake/alert Behavior During Therapy: WFL for tasks assessed/performed Overall Cognitive Status: History of cognitive impairments - at baseline                                 General Comments: Pt with dementia at baseline. Also with hallucinations, however, son present and reports this is normal      General Comments General comments (skin integrity, edema, etc.): Pt's son present throughout session.     Exercises     Assessment/Plan    PT Assessment Patient needs continued PT services  PT Problem List Decreased strength;Decreased balance;Decreased mobility;Decreased cognition;Decreased knowledge of use of DME;Decreased safety awareness;Decreased knowledge of precautions       PT Treatment Interventions DME instruction;Gait training;Therapeutic activities;Functional mobility training;Therapeutic exercise;Balance training;Patient/family education    PT Goals (Current goals can be found in the Care Plan section)  Acute Rehab PT Goals Patient Stated Goal: For pt to return home per son PT Goal Formulation: With family Time For Goal Achievement: 11/22/18 Potential to Achieve Goals: Good    Frequency Min 3X/week   Barriers to discharge        Co-evaluation               AM-PAC PT "6 Clicks" Mobility  Outcome Measure Help needed turning from your back to your side while in a flat bed without using bedrails?: A Little Help needed moving from lying on your back to sitting on the side of a flat bed without using bedrails?: A Little Help needed moving to and from a bed to a chair (including a wheelchair)?: A Little Help needed standing up from a chair using your arms (e.g., wheelchair or bedside chair)?: A Little Help needed to walk in hospital room?: A Little Help needed climbing 3-5 steps with a railing? : A Lot 6 Click Score: 17     End of Session Equipment Utilized During Treatment: Gait belt Activity Tolerance: Patient tolerated treatment well Patient left: in bed;with call bell/phone within reach;with family/visitor present Nurse Communication: Mobility status PT Visit Diagnosis: Unsteadiness on feet (R26.81);Muscle weakness (generalized) (M62.81)    Time: 1443-1540 PT Time Calculation (min) (ACUTE ONLY): 28 min   Charges:   PT Evaluation $PT Eval Moderate Complexity: 1 Mod PT Treatments $Gait Training: 8-22 mins        Leighton Ruff, PT, DPT  Acute Rehabilitation Services  Pager: [phone] Office: [phone]   Rudean Hitt 11/08/2018, 1:50 PM

## 2018-11-08 NOTE — Progress Notes (Signed)
  Echocardiogram 2D Echocardiogram has been performed.  Burnett Kanaris 11/08/2018, 11:22 AM

## 2018-11-08 NOTE — Care Management (Signed)
Spoke w patient's son at bedside. He declined offer to set up [facility] services. He states they have had it before in the past and she does not participate. He states it would be a waste of time. He states that she has all needed DME in the home. No other CM needs identified.

## 2018-11-08 NOTE — Consult Note (Addendum)
Cardiology Consultation:   Patient ID: Connie Obrien MRN: [id number]; DOB: 04-04-1919  Admit date: 11/07/2018 Date of Consult: 11/08/2018  Primary Care Provider: Harlan Stains, MD Primary Cardiologist: No primary care provider on file.  Primary Electrophysiologist:  None    Patient Profile:   Connie Obrien is a 85 y.o. female with a hx of GERD, blindness, IBS, HTN, diverticulosis and asthma who is being seen today for the evaluation of CHF, Afib at the request of Dr. Doristine Bosworth.  History of Present Illness:   [name] is a 85 yo female with PMH noted above.  She has never been evaluated by cardiology in the past.  She is followed by her PCP.  She currently lives at her home and her children take turns staying with her a week at a time.  Son at the bedside states she is somewhat independent but does require a lot of assistance with her ADLs.  Son reports she was in her usual state of health yesterday.  He actually took her to get her hair fixed and came back home around 1 PM.  She ate lunch and then shortly afterwards developed centralized chest pressure with some shortness of breath.  States symptoms lingered for several hours and were still present by the time he was making her dinner.  Reported she seemed a little more short of breath, diaphoretic and with worsening chest pressure.  She denied any palpitations.  He called EMS.  On their arrival she was noted to be in atrial fibrillation with RVR.  Notes indicate that she spontaneously converted to sinus tachycardia and then to sinus rhythm with rates in the 80s.  In the ED her labs showed stable electrolytes, BNP 198, high-sensitivity troponin 20>> 37>> 45, hemoglobin 13.4.  EKG showed sinus tachycardia with multiform PVCs and right bundle branch block.  Chest x-ray was negative.  She was given a dose of IV Lasix in the ED and admitted to internal medicine for further work-up.  Placed on metoprolol 12.5 mg twice daily on admission.   Heart  Pathway Score:     Past Medical History:  Diagnosis Date  . Allergic rhinitis   . Asthma   . Deaf   . Diverticulosis   . GERD (gastroesophageal reflux disease)   . Glaucoma   . HTN (hypertension)   . IBS (irritable bowel syndrome)   . Obesity   . Osteoarthritis   . Osteopenia   . Scoliosis     Past Surgical History:  Procedure Laterality Date  . ABDOMINAL HERNIA REPAIR    . APPENDECTOMY    . BREAST BIOPSY    . CATARACT EXTRACTION    . COLON RESECTION    . HEMORRHOID SURGERY    . NISSEN FUNDOPLICATION    . TOTAL ABDOMINAL HYSTERECTOMY       Home Medications:  Prior to Admission medications   Medication Sig Start Date End Date Taking? Authorizing Provider  acetaminophen (TYLENOL) 500 MG tablet Take 500 mg by mouth 3 (three) times daily.   Yes [provider]  aspirin EC 81 MG tablet Take 162 mg by mouth daily.   Yes [provider]  augmented betamethasone dipropionate (DIPROLENE-AF) 2.37 % cream 1 application See admin instructions. Apply to affected area daily 3-4 times a week as needed/as directed (vaginal area)   Yes [provider]  calcium citrate-vitamin D (CITRACAL+D) 315-200 MG-UNIT per tablet Take 1 tablet by mouth 2 (two) times daily.    Yes [provider]  cetirizine (ZYRTEC) 10 MG tablet Take 10 mg by mouth daily.     Yes [provider]  Cholecalciferol (VITAMIN D-3) 25 MCG (1000 UT) CAPS Take 1,000 Units by mouth daily.   Yes [provider]  clobetasol (TEMOVATE) 0.05 % external solution Apply 1 application topically 2 (two) times a week.   Yes [provider]  dexlansoprazole (DEXILANT) 60 MG capsule Take 60 mg by mouth daily.   Yes [provider]  dorzolamide-timolol (COSOPT) 22.3-6.8 MG/ML ophthalmic solution Place 1 drop into both eyes 2 (two) times daily.   Yes [provider]  fluticasone (FLONASE) 50 MCG/ACT nasal spray Place 2 sprays into both nostrils daily as needed for  allergies or rhinitis.   Yes [provider]  furosemide (LASIX) 20 MG tablet Take 20 mg by mouth See admin instructions. Take 20 mg by mouth in the morning and an additional 20 mg once daily as needed for fluid or edema   Yes [provider]  metoprolol succinate (TOPROL-XL) 25 MG 24 hr tablet Take 12.5 mg by mouth daily.   Yes [provider]  mupirocin ointment (BACTROBAN) 2 % Place 1 application into the nose 2 (two) times daily as needed (for sore in nose).   Yes [provider]  Polyvinyl Alcohol-Povidone (REFRESH OP) Place 1 drop into both eyes 3 (three) times daily as needed (for dryness).    Yes [provider]  potassium chloride SA (K-DUR) 20 MEQ tablet Take 20 mEq by mouth daily.   Yes [provider]  SYMBICORT 160-4.5 MCG/ACT inhaler INHALE ONE PUFF INTO LUNGS TWICE DAILY Patient taking differently: Inhale 2 puffs into the lungs 2 (two) times daily.  08/30/14  Yes Brand Males, MD  triamcinolone cream (KENALOG) 0.5 % Apply 1 application topically See admin instructions. Apply a pea-sized amount to affected area twice weekly as needed/as directed   Yes [provider]  VENTOLIN HFA 108 (90 BASE) MCG/ACT inhaler Inhale 2 puffs into the lungs See admin instructions. Inhale 2 puffs into the lungs every 4-6 hours as needed for wheezing or shortness of breath 04/26/11  Yes [provider]  vitamin B-12 (CYANOCOBALAMIN) 500 MCG tablet Take 500 mcg by mouth daily.   Yes [provider]  vitamin C (ASCORBIC ACID) 500 MG tablet Take 500 mg by mouth daily.   Yes [provider]  vitamin E 400 UNIT capsule Take 400 Units by mouth daily.     Yes [provider]  beclomethasone (QVAR) 80 MCG/ACT inhaler Inhale 2 puffs into the lungs 2 (two) times daily. 09/29/10 05/31/11  Parrett, Fonnie Mu, NP    Inpatient Medications: Scheduled Meds: . acetaminophen  500 mg Oral TID  . albuterol  3 mL Inhalation See  admin instructions  . aspirin EC  162 mg Oral Daily  . calcium-vitamin D  1 tablet Oral BID  . dorzolamide-timolol  1 drop Both Eyes BID  . enoxaparin (LOVENOX) injection  40 mg Subcutaneous Q24H  . furosemide  20 mg Oral Daily  . loratadine  10 mg Oral Daily  . metoprolol tartrate  25 mg Oral BID  . mometasone-formoterol  2 puff Inhalation BID  . pantoprazole  80 mg Oral Daily  . [START ON 11/09/2018] pneumococcal 23 valent vaccine  0.5 mL Intramuscular Tomorrow-1000  . potassium chloride SA  20 mEq Oral Daily  . sodium chloride flush  3 mL Intravenous Q12H  . vitamin B-12  500 mcg Oral Daily  . vitamin  C  500 mg Oral Daily  . vitamin E  400 Units Oral Daily   Continuous Infusions: . sodium chloride     PRN Meds: sodium chloride, acetaminophen, fluticasone, ondansetron (ZOFRAN) IV, sodium chloride flush  Allergies:    Allergies  Allergen Reactions  . Flexeril [Cyclobenzaprine] Other (See Comments)    Hallucinations   . Vicodin [Hydrocodone-Acetaminophen] Other (See Comments)    Hallucinations   . Septra [Bactrim] Rash  . Sulfa Antibiotics Rash    Social History:   Social History   Socioeconomic History  . Marital status: Widowed    Spouse name: Not on file  . Number of children: 8  . Years of education: Not on file  . Highest education level: Not on file  Occupational History  . Occupation: retired from [employer]  . Financial resource strain: Not on file  . Food insecurity    Worry: Not on file    Inability: Not on file  . Transportation needs    Medical: Not on file    Non-medical: Not on file  Tobacco Use  . Smoking status: Never Smoker  . Smokeless tobacco: Never Used  Substance and Sexual Activity  . Alcohol use: No  . Drug use: No  . Sexual activity: Not on file  Lifestyle  . Physical activity    Days per week: Not on file    Minutes per session: Not on file  . Stress: Not on file  Relationships  . Social Product manager on phone: Not on file    Gets together: Not on file    Attends religious service: Not on file    Active member of club or organization: Not on file    Attends meetings of clubs or organizations: Not on file    Relationship status: Not on file  . Intimate partner violence    Fear of current or ex partner: Not on file    Emotionally abused: Not on file    Physically abused: Not on file    Forced sexual activity: Not on file  Other Topics Concern  . 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     Family History:    Family History  Problem Relation Age of Onset  . Coronary artery disease Father   . Diabetes Father   . Coronary artery disease Mother   . Coronary artery disease Brother   . Prostate cancer Brother        x2     ROS:  Please see the history of present illness.   All other ROS reviewed and negative.     Physical Exam/Data:   Vitals:   11/08/18 0204 11/08/18 0714 11/08/18 0821 11/08/18 0854  BP:  (!) 166/89 (!) 175/78   Pulse: 81 90 97 83  Resp: (!) 24 (!) 22 20 18   Temp:  (!) 97.4 F (36.3 C) 97.9 F (36.6 C)   TempSrc:  Oral Oral   SpO2: 95% 95% 95% 95%  Weight:  80 kg    Height:        Intake/Output Summary (Last 24 hours) at 11/08/2018 1045 Last data filed at 11/08/2018 0630 Gross per 24 hour  Intake 240  ml  Output 1200 ml  Net -960 ml   Last 3 Weights 11/08/2018 11/07/2018 12/31/2016  Weight (lbs) 176 lb 4.8 oz 179 lb 14.4 oz 178 lb  Weight (kg) 79.969 kg 81.602 kg 80.74 kg     Body mass index is 33.31 kg/m.  General:  Well nourished, obese older WF developed, in no acute distress.  HEENT: Blind Lymph: no adenopathy Neck: no JVD Vascular: No carotid bruits Cardiac:  normal S1, S2; RRR; no murmur  Lungs:  clear to auscultation  bilaterally, no wheezing, rhonchi or rales  Abd: soft, nontender, no hepatomegaly  Ext: trace LE edema Musculoskeletal:  No deformities, BUE and BLE strength normal and equal Skin: warm and dry  Neuro:  CNs 2-12 intact, no focal abnormalities noted Psych:  Normal affect   EKG:  The EKG was personally reviewed and demonstrates:  ST multifocal PVCs with RBBB Telemetry:  Telemetry was personally reviewed and demonstrates: SR  Relevant CV Studies:  TTE: pending  Laboratory Data:  High Sensitivity Troponin:   Recent Labs  Lab 11/07/18 2007 11/07/18 2217 11/08/18 0014  TROPONINIHS 20* 37* 45*     Cardiac EnzymesNo results for input(s): TROPONINI in the last 168 hours. No results for input(s): TROPIPOC in the last 168 hours.  Chemistry Recent Labs  Lab 11/07/18 2007 11/08/18 0014  NA 140 142  K 4.0 4.1  CL 107 109  CO2 21* 25  GLUCOSE 232* 127*  BUN 16 15  CREATININE 1.00 0.92  CALCIUM 8.8* 9.0  GFRNONAA 47* 52*  GFRAA 55* >60  ANIONGAP 12 8    Recent Labs  Lab 11/07/18 2007  PROT 5.6*  ALBUMIN 3.1*  AST 21  ALT 13  ALKPHOS 62  BILITOT 0.6   Hematology Recent Labs  Lab 11/07/18 2007  WBC 8.7  RBC 4.13  HGB 13.4  HCT 42.3  MCV 102.4*  MCH 32.4  MCHC 31.7  RDW 13.7  PLT 198   BNP Recent Labs  Lab 11/07/18 2007  BNP 198.0*    DDimer No results for input(s): DDIMER in the last 168 hours.   Radiology/Studies:  Dg Chest 2 View  Result Date: 11/07/2018 CLINICAL DATA:  Chest pain EXAM: CHEST - 2 VIEW COMPARISON:  May 26, 2017 FINDINGS: Heart size is enlarged. There is persistent elevation of the left hemidiaphragm which is similar to prior study. There is no pneumothorax. No large pleural effusion. There may be some blunting of the costophrenic angles which is similar to prior study. The osseous mineralization is decreased. There is no displaced fracture involving the visualized thoracic spine. IMPRESSION: No active cardiopulmonary disease.  Electronically Signed   By: Constance Holster M.D.   On: 11/07/2018 20:48    Assessment and Plan:   Connie Obrien is a 85 y.o. female with a hx of GERD, blindness, IBS, HTN, diverticulosis and asthma who is being seen today for the evaluation of CHF, Afib at the request of Dr. Doristine Bosworth.  1. PAF: episode started presumably around 1pm yesterday. Spontaneously converted to ST-->SR and had maintained since. Given her advanced age, blindness--> increased fall risks would not consider her a good candidate for [facility]. Her blood pressure is elevated, therefore can increase her metoprolol.   2. Chest pain: suspect rated related. No further episodes since conversion. Suspect mild troponin elevation is related to demand ischemia. No further work up planned.   3. Volume overload: BNP mildly elevated on admission. Given IV lasix 40mg  x1 with improvement. Currently on lasix  20mg  daily prior to admission. Would continue the same with PRN dosing for edema or shortness of breath.  -- echo pending  4. HTN: blood pressures are not well controlled. Will increase BB as above.   For questions or updates, please contact [place] Please consult [email or web address] for contact info under     Signed, Reino Bellis, NP  11/08/2018 10:45 AM  I have personally seen and examined this patient with Reino Bellis, NP. I agree with the assessment and plan as outlined above. She is a pleasant 85 yo female who is blind and has dementia. No prior heart disease. Dyspnea yesterday with mild chest pressure and her son called 911.  She was initially in atrial fib but converted to sinus.  Troponin is negative.  EKG is personally reviewed by me and shows sinus tachy with RBBB and PVCs Echo reviewed by me shows normal LV systolic function, no significant valve disease (official reading pending)  My exam:  General: Elderly female in NAD.   HEENT: OP clear, mucus membranes moist  SKIN: warm, dry. No rashes. Neuro: No focal  deficits  Musculoskeletal: Muscle strength 5/5 all ext  Psychiatric: Mood and affect normal  Neck: No JVD, no carotid bruits, no thyromegaly, no lymphadenopathy.  Lungs:Clear bilaterally, no wheezes, rhonci, crackles Cardiovascular: Regular rate and rhythm. No murmurs, gallops or rubs. Abdomen:Soft. Bowel sounds present. Non-tender.  Extremities: No lower extremity edema. Pulses are 2 + in the bilateral DP/PT.  She had an isolated episode of atrial fib. I have discussed long term anti-coagulation with the patient and her son and we all agree that it is not a good idea given her age, dementia and legal blindness. I would continue her beta blocker.  She is not grossly volume overloaded on exam. Continue daily Lasix 20 mg. She can use extra for days she has more dyspnea or LE edema No plans for further cardiac workup.  She can be discharged from our perspective.   Lauree Chandler 11/08/2018 12:02 PM

## 2018-11-09 DIAGNOSIS — E1169 Type 2 diabetes mellitus with other specified complication: Secondary | ICD-10-CM | POA: Diagnosis not present

## 2018-11-09 DIAGNOSIS — E785 Hyperlipidemia, unspecified: Secondary | ICD-10-CM | POA: Diagnosis not present

## 2018-11-09 DIAGNOSIS — I480 Paroxysmal atrial fibrillation: Secondary | ICD-10-CM | POA: Diagnosis not present

## 2018-12-04 DIAGNOSIS — R300 Dysuria: Secondary | ICD-10-CM | POA: Diagnosis not present

## 2018-12-04 DIAGNOSIS — I10 Essential (primary) hypertension: Secondary | ICD-10-CM | POA: Diagnosis not present

## 2018-12-04 DIAGNOSIS — E785 Hyperlipidemia, unspecified: Secondary | ICD-10-CM | POA: Diagnosis not present

## 2018-12-04 DIAGNOSIS — I480 Paroxysmal atrial fibrillation: Secondary | ICD-10-CM | POA: Diagnosis not present

## 2018-12-04 DIAGNOSIS — R600 Localized edema: Secondary | ICD-10-CM | POA: Diagnosis not present

## 2018-12-04 DIAGNOSIS — E1169 Type 2 diabetes mellitus with other specified complication: Secondary | ICD-10-CM | POA: Diagnosis not present

## 2018-12-04 DIAGNOSIS — Z23 Encounter for immunization: Secondary | ICD-10-CM | POA: Diagnosis not present

## 2019-01-15 DIAGNOSIS — H16223 Keratoconjunctivitis sicca, not specified as Sjogren's, bilateral: Secondary | ICD-10-CM | POA: Diagnosis not present

## 2019-01-15 DIAGNOSIS — H401133 Primary open-angle glaucoma, bilateral, severe stage: Secondary | ICD-10-CM | POA: Diagnosis not present

## 2019-01-15 DIAGNOSIS — H10502 Unspecified blepharoconjunctivitis, left eye: Secondary | ICD-10-CM | POA: Diagnosis not present

## 2019-01-15 DIAGNOSIS — H1813 Bullous keratopathy, bilateral: Secondary | ICD-10-CM | POA: Diagnosis not present

## 2019-01-15 DIAGNOSIS — H353230 Exudative age-related macular degeneration, bilateral, stage unspecified: Secondary | ICD-10-CM | POA: Diagnosis not present

## 2019-01-15 DIAGNOSIS — G245 Blepharospasm: Secondary | ICD-10-CM | POA: Diagnosis not present

## 2019-01-15 DIAGNOSIS — Z961 Presence of intraocular lens: Secondary | ICD-10-CM | POA: Diagnosis not present

## 2019-01-22 DIAGNOSIS — G245 Blepharospasm: Secondary | ICD-10-CM | POA: Diagnosis not present

## 2019-01-22 DIAGNOSIS — H1813 Bullous keratopathy, bilateral: Secondary | ICD-10-CM | POA: Diagnosis not present

## 2019-01-22 DIAGNOSIS — H353230 Exudative age-related macular degeneration, bilateral, stage unspecified: Secondary | ICD-10-CM | POA: Diagnosis not present

## 2019-01-22 DIAGNOSIS — H401133 Primary open-angle glaucoma, bilateral, severe stage: Secondary | ICD-10-CM | POA: Diagnosis not present

## 2019-01-22 DIAGNOSIS — H10502 Unspecified blepharoconjunctivitis, left eye: Secondary | ICD-10-CM | POA: Diagnosis not present

## 2019-01-22 DIAGNOSIS — H16223 Keratoconjunctivitis sicca, not specified as Sjogren's, bilateral: Secondary | ICD-10-CM | POA: Diagnosis not present

## 2019-01-22 DIAGNOSIS — Z961 Presence of intraocular lens: Secondary | ICD-10-CM | POA: Diagnosis not present

## 2019-02-21 ENCOUNTER — Telehealth: Payer: Self-pay

## 2019-02-21 NOTE — Telephone Encounter (Signed)
NOTES ON FILE FROM [facility] [phone] SENT REFERRAL IN PROFICIENT

## 2019-02-22 DIAGNOSIS — I498 Other specified cardiac arrhythmias: Secondary | ICD-10-CM | POA: Diagnosis not present

## 2019-02-22 DIAGNOSIS — I4510 Unspecified right bundle-branch block: Secondary | ICD-10-CM | POA: Diagnosis not present

## 2019-03-26 DIAGNOSIS — R000 Tachycardia, unspecified: Secondary | ICD-10-CM | POA: Diagnosis not present

## 2019-03-30 DIAGNOSIS — I493 Ventricular premature depolarization: Secondary | ICD-10-CM | POA: Diagnosis not present

## 2019-03-30 DIAGNOSIS — I4891 Unspecified atrial fibrillation: Secondary | ICD-10-CM | POA: Diagnosis not present

## 2019-03-30 DIAGNOSIS — I471 Supraventricular tachycardia: Secondary | ICD-10-CM | POA: Diagnosis not present

## 2019-03-30 DIAGNOSIS — I454 Nonspecific intraventricular block: Secondary | ICD-10-CM | POA: Diagnosis not present

## 2019-04-06 DIAGNOSIS — G459 Transient cerebral ischemic attack, unspecified: Secondary | ICD-10-CM | POA: Diagnosis not present

## 2019-04-06 DIAGNOSIS — I10 Essential (primary) hypertension: Secondary | ICD-10-CM | POA: Diagnosis not present

## 2019-04-06 DIAGNOSIS — E785 Hyperlipidemia, unspecified: Secondary | ICD-10-CM | POA: Diagnosis not present

## 2019-04-06 DIAGNOSIS — E1169 Type 2 diabetes mellitus with other specified complication: Secondary | ICD-10-CM | POA: Diagnosis not present

## 2019-04-06 DIAGNOSIS — J4540 Moderate persistent asthma, uncomplicated: Secondary | ICD-10-CM | POA: Diagnosis not present

## 2019-04-06 DIAGNOSIS — I480 Paroxysmal atrial fibrillation: Secondary | ICD-10-CM | POA: Diagnosis not present

## 2019-04-12 DIAGNOSIS — R002 Palpitations: Secondary | ICD-10-CM | POA: Diagnosis not present

## 2019-04-12 DIAGNOSIS — R079 Chest pain, unspecified: Secondary | ICD-10-CM | POA: Diagnosis not present

## 2019-04-12 DIAGNOSIS — I10 Essential (primary) hypertension: Secondary | ICD-10-CM | POA: Diagnosis not present

## 2019-04-12 DIAGNOSIS — I471 Supraventricular tachycardia: Secondary | ICD-10-CM | POA: Diagnosis not present

## 2019-05-22 DIAGNOSIS — I10 Essential (primary) hypertension: Secondary | ICD-10-CM | POA: Diagnosis not present

## 2019-05-22 DIAGNOSIS — I444 Left anterior fascicular block: Secondary | ICD-10-CM | POA: Diagnosis not present

## 2019-05-22 DIAGNOSIS — I4510 Unspecified right bundle-branch block: Secondary | ICD-10-CM | POA: Diagnosis not present

## 2019-05-22 DIAGNOSIS — I480 Paroxysmal atrial fibrillation: Secondary | ICD-10-CM | POA: Diagnosis not present

## 2019-05-22 DIAGNOSIS — R002 Palpitations: Secondary | ICD-10-CM | POA: Diagnosis not present

## 2019-05-22 DIAGNOSIS — R079 Chest pain, unspecified: Secondary | ICD-10-CM | POA: Diagnosis not present

## 2019-05-22 DIAGNOSIS — I452 Bifascicular block: Secondary | ICD-10-CM | POA: Diagnosis not present

## 2019-05-22 DIAGNOSIS — R0602 Shortness of breath: Secondary | ICD-10-CM | POA: Diagnosis not present

## 2019-05-22 DIAGNOSIS — I471 Supraventricular tachycardia: Secondary | ICD-10-CM | POA: Diagnosis not present

## 2019-06-11 DIAGNOSIS — H1813 Bullous keratopathy, bilateral: Secondary | ICD-10-CM | POA: Diagnosis not present

## 2019-06-11 DIAGNOSIS — H353230 Exudative age-related macular degeneration, bilateral, stage unspecified: Secondary | ICD-10-CM | POA: Diagnosis not present

## 2019-06-11 DIAGNOSIS — H16223 Keratoconjunctivitis sicca, not specified as Sjogren's, bilateral: Secondary | ICD-10-CM | POA: Diagnosis not present

## 2019-06-11 DIAGNOSIS — H401133 Primary open-angle glaucoma, bilateral, severe stage: Secondary | ICD-10-CM | POA: Diagnosis not present

## 2019-06-11 DIAGNOSIS — H18593 Other hereditary corneal dystrophies, bilateral: Secondary | ICD-10-CM | POA: Diagnosis not present

## 2019-06-11 DIAGNOSIS — Z961 Presence of intraocular lens: Secondary | ICD-10-CM | POA: Diagnosis not present

## 2019-06-20 DIAGNOSIS — D509 Iron deficiency anemia, unspecified: Secondary | ICD-10-CM | POA: Diagnosis not present

## 2019-06-20 DIAGNOSIS — G459 Transient cerebral ischemic attack, unspecified: Secondary | ICD-10-CM | POA: Diagnosis not present

## 2019-06-20 DIAGNOSIS — E1169 Type 2 diabetes mellitus with other specified complication: Secondary | ICD-10-CM | POA: Diagnosis not present

## 2019-06-20 DIAGNOSIS — I10 Essential (primary) hypertension: Secondary | ICD-10-CM | POA: Diagnosis not present

## 2019-06-20 DIAGNOSIS — J4540 Moderate persistent asthma, uncomplicated: Secondary | ICD-10-CM | POA: Diagnosis not present

## 2019-06-20 DIAGNOSIS — I480 Paroxysmal atrial fibrillation: Secondary | ICD-10-CM | POA: Diagnosis not present

## 2019-06-20 DIAGNOSIS — E785 Hyperlipidemia, unspecified: Secondary | ICD-10-CM | POA: Diagnosis not present

## 2019-08-10 DIAGNOSIS — R350 Frequency of micturition: Secondary | ICD-10-CM | POA: Diagnosis not present

## 2019-08-10 DIAGNOSIS — B372 Candidiasis of skin and nail: Secondary | ICD-10-CM | POA: Diagnosis not present

## 2019-08-10 DIAGNOSIS — N3000 Acute cystitis without hematuria: Secondary | ICD-10-CM | POA: Diagnosis not present

## 2019-08-19 DIAGNOSIS — D509 Iron deficiency anemia, unspecified: Secondary | ICD-10-CM | POA: Diagnosis not present

## 2019-08-19 DIAGNOSIS — E785 Hyperlipidemia, unspecified: Secondary | ICD-10-CM | POA: Diagnosis not present

## 2019-08-19 DIAGNOSIS — J4540 Moderate persistent asthma, uncomplicated: Secondary | ICD-10-CM | POA: Diagnosis not present

## 2019-08-19 DIAGNOSIS — I480 Paroxysmal atrial fibrillation: Secondary | ICD-10-CM | POA: Diagnosis not present

## 2019-08-19 DIAGNOSIS — G459 Transient cerebral ischemic attack, unspecified: Secondary | ICD-10-CM | POA: Diagnosis not present

## 2019-08-19 DIAGNOSIS — I10 Essential (primary) hypertension: Secondary | ICD-10-CM | POA: Diagnosis not present

## 2019-08-19 DIAGNOSIS — E1169 Type 2 diabetes mellitus with other specified complication: Secondary | ICD-10-CM | POA: Diagnosis not present

## 2019-09-27 DIAGNOSIS — G459 Transient cerebral ischemic attack, unspecified: Secondary | ICD-10-CM | POA: Diagnosis not present

## 2019-09-27 DIAGNOSIS — E785 Hyperlipidemia, unspecified: Secondary | ICD-10-CM | POA: Diagnosis not present

## 2019-09-27 DIAGNOSIS — J4540 Moderate persistent asthma, uncomplicated: Secondary | ICD-10-CM | POA: Diagnosis not present

## 2019-09-27 DIAGNOSIS — I480 Paroxysmal atrial fibrillation: Secondary | ICD-10-CM | POA: Diagnosis not present

## 2019-09-27 DIAGNOSIS — D509 Iron deficiency anemia, unspecified: Secondary | ICD-10-CM | POA: Diagnosis not present

## 2019-09-27 DIAGNOSIS — I10 Essential (primary) hypertension: Secondary | ICD-10-CM | POA: Diagnosis not present

## 2019-09-27 DIAGNOSIS — E1169 Type 2 diabetes mellitus with other specified complication: Secondary | ICD-10-CM | POA: Diagnosis not present

## 2019-09-28 DIAGNOSIS — R350 Frequency of micturition: Secondary | ICD-10-CM | POA: Diagnosis not present

## 2019-11-09 DIAGNOSIS — J4540 Moderate persistent asthma, uncomplicated: Secondary | ICD-10-CM | POA: Diagnosis not present

## 2019-11-09 DIAGNOSIS — D509 Iron deficiency anemia, unspecified: Secondary | ICD-10-CM | POA: Diagnosis not present

## 2019-11-09 DIAGNOSIS — I10 Essential (primary) hypertension: Secondary | ICD-10-CM | POA: Diagnosis not present

## 2019-11-09 DIAGNOSIS — G459 Transient cerebral ischemic attack, unspecified: Secondary | ICD-10-CM | POA: Diagnosis not present

## 2019-11-09 DIAGNOSIS — I480 Paroxysmal atrial fibrillation: Secondary | ICD-10-CM | POA: Diagnosis not present

## 2019-11-09 DIAGNOSIS — E1169 Type 2 diabetes mellitus with other specified complication: Secondary | ICD-10-CM | POA: Diagnosis not present

## 2019-11-09 DIAGNOSIS — E785 Hyperlipidemia, unspecified: Secondary | ICD-10-CM | POA: Diagnosis not present

## 2019-11-22 DIAGNOSIS — H353230 Exudative age-related macular degeneration, bilateral, stage unspecified: Secondary | ICD-10-CM | POA: Diagnosis not present

## 2019-11-22 DIAGNOSIS — Z9889 Other specified postprocedural states: Secondary | ICD-10-CM | POA: Diagnosis not present

## 2019-11-22 DIAGNOSIS — H401133 Primary open-angle glaucoma, bilateral, severe stage: Secondary | ICD-10-CM | POA: Diagnosis not present

## 2019-11-22 DIAGNOSIS — H18593 Other hereditary corneal dystrophies, bilateral: Secondary | ICD-10-CM | POA: Diagnosis not present

## 2019-11-22 DIAGNOSIS — H16223 Keratoconjunctivitis sicca, not specified as Sjogren's, bilateral: Secondary | ICD-10-CM | POA: Diagnosis not present

## 2019-11-22 DIAGNOSIS — Z961 Presence of intraocular lens: Secondary | ICD-10-CM | POA: Diagnosis not present

## 2019-11-22 DIAGNOSIS — H1813 Bullous keratopathy, bilateral: Secondary | ICD-10-CM | POA: Diagnosis not present

## 2019-12-10 DIAGNOSIS — J4540 Moderate persistent asthma, uncomplicated: Secondary | ICD-10-CM | POA: Diagnosis not present

## 2019-12-10 DIAGNOSIS — E1169 Type 2 diabetes mellitus with other specified complication: Secondary | ICD-10-CM | POA: Diagnosis not present

## 2019-12-10 DIAGNOSIS — I480 Paroxysmal atrial fibrillation: Secondary | ICD-10-CM | POA: Diagnosis not present

## 2019-12-10 DIAGNOSIS — G459 Transient cerebral ischemic attack, unspecified: Secondary | ICD-10-CM | POA: Diagnosis not present

## 2019-12-10 DIAGNOSIS — E785 Hyperlipidemia, unspecified: Secondary | ICD-10-CM | POA: Diagnosis not present

## 2019-12-10 DIAGNOSIS — I10 Essential (primary) hypertension: Secondary | ICD-10-CM | POA: Diagnosis not present

## 2019-12-10 DIAGNOSIS — D509 Iron deficiency anemia, unspecified: Secondary | ICD-10-CM | POA: Diagnosis not present

## 2020-01-01 DIAGNOSIS — E669 Obesity, unspecified: Secondary | ICD-10-CM | POA: Diagnosis not present

## 2020-01-01 DIAGNOSIS — K219 Gastro-esophageal reflux disease without esophagitis: Secondary | ICD-10-CM | POA: Diagnosis not present

## 2020-01-01 DIAGNOSIS — K5730 Diverticulosis of large intestine without perforation or abscess without bleeding: Secondary | ICD-10-CM | POA: Diagnosis not present

## 2020-01-01 DIAGNOSIS — R1111 Vomiting without nausea: Secondary | ICD-10-CM | POA: Diagnosis not present

## 2020-01-02 ENCOUNTER — Other Ambulatory Visit: Payer: Self-pay | Admitting: Gastroenterology

## 2020-01-02 DIAGNOSIS — R112 Nausea with vomiting, unspecified: Secondary | ICD-10-CM

## 2020-01-02 DIAGNOSIS — R1310 Dysphagia, unspecified: Secondary | ICD-10-CM

## 2020-01-09 ENCOUNTER — Ambulatory Visit
Admission: RE | Admit: 2020-01-09 | Discharge: 2020-01-09 | Disposition: A | Discharge status: Home / Self Care | Payer: Medicare Other | Source: Ambulatory Visit | Attending: Gastroenterology | Admitting: Gastroenterology

## 2020-01-09 ENCOUNTER — Other Ambulatory Visit: Payer: Self-pay | Admitting: Gastroenterology

## 2020-01-09 DIAGNOSIS — R1310 Dysphagia, unspecified: Secondary | ICD-10-CM

## 2020-01-09 DIAGNOSIS — R112 Nausea with vomiting, unspecified: Secondary | ICD-10-CM

## 2020-01-09 DIAGNOSIS — K449 Diaphragmatic hernia without obstruction or gangrene: Secondary | ICD-10-CM | POA: Diagnosis not present

## 2020-01-09 DIAGNOSIS — K828 Other specified diseases of gallbladder: Secondary | ICD-10-CM | POA: Diagnosis not present

## 2020-01-09 DIAGNOSIS — K224 Dyskinesia of esophagus: Secondary | ICD-10-CM | POA: Diagnosis not present

## 2020-01-10 ENCOUNTER — Other Ambulatory Visit: Payer: Self-pay | Admitting: Gastroenterology

## 2020-01-15 ENCOUNTER — Other Ambulatory Visit (HOSPITAL_COMMUNITY)
Admission: RE | Admit: 2020-01-15 | Discharge: 2020-01-15 | Disposition: A | Discharge status: Home / Self Care | Payer: Medicare Other | Source: Ambulatory Visit | Attending: Gastroenterology | Admitting: Gastroenterology

## 2020-01-15 DIAGNOSIS — Z01812 Encounter for preprocedural laboratory examination: Secondary | ICD-10-CM | POA: Diagnosis not present

## 2020-01-15 DIAGNOSIS — Z20822 Contact with and (suspected) exposure to covid-19: Secondary | ICD-10-CM | POA: Insufficient documentation

## 2020-01-15 LAB — SARS CORONAVIRUS 2 (TAT 6-24 HRS): SARS Coronavirus 2: NEGATIVE

## 2020-01-16 DIAGNOSIS — E785 Hyperlipidemia, unspecified: Secondary | ICD-10-CM | POA: Diagnosis not present

## 2020-01-16 DIAGNOSIS — G459 Transient cerebral ischemic attack, unspecified: Secondary | ICD-10-CM | POA: Diagnosis not present

## 2020-01-16 DIAGNOSIS — D509 Iron deficiency anemia, unspecified: Secondary | ICD-10-CM | POA: Diagnosis not present

## 2020-01-16 DIAGNOSIS — I480 Paroxysmal atrial fibrillation: Secondary | ICD-10-CM | POA: Diagnosis not present

## 2020-01-16 DIAGNOSIS — E1169 Type 2 diabetes mellitus with other specified complication: Secondary | ICD-10-CM | POA: Diagnosis not present

## 2020-01-16 DIAGNOSIS — I10 Essential (primary) hypertension: Secondary | ICD-10-CM | POA: Diagnosis not present

## 2020-01-16 DIAGNOSIS — J4540 Moderate persistent asthma, uncomplicated: Secondary | ICD-10-CM | POA: Diagnosis not present

## 2020-01-18 ENCOUNTER — Ambulatory Visit (HOSPITAL_COMMUNITY)
Admission: RE | Admit: 2020-01-18 | Discharge: 2020-01-18 | Disposition: A | Discharge status: Home / Self Care | Payer: Medicare Other | Attending: Gastroenterology | Admitting: Gastroenterology

## 2020-01-18 ENCOUNTER — Ambulatory Visit (HOSPITAL_COMMUNITY): Payer: Medicare Other | Admitting: Certified Registered"

## 2020-01-18 ENCOUNTER — Encounter (HOSPITAL_COMMUNITY)
Admission: RE | Disposition: A | Discharge status: Home / Self Care | Payer: Self-pay | Source: Home / Self Care | Attending: Gastroenterology

## 2020-01-18 ENCOUNTER — Encounter (HOSPITAL_COMMUNITY): Payer: Self-pay | Admitting: Gastroenterology

## 2020-01-18 ENCOUNTER — Other Ambulatory Visit: Payer: Self-pay

## 2020-01-18 DIAGNOSIS — R933 Abnormal findings on diagnostic imaging of other parts of digestive tract: Secondary | ICD-10-CM | POA: Diagnosis not present

## 2020-01-18 DIAGNOSIS — R1310 Dysphagia, unspecified: Secondary | ICD-10-CM | POA: Diagnosis not present

## 2020-01-18 DIAGNOSIS — I5031 Acute diastolic (congestive) heart failure: Secondary | ICD-10-CM | POA: Diagnosis not present

## 2020-01-18 DIAGNOSIS — I480 Paroxysmal atrial fibrillation: Secondary | ICD-10-CM | POA: Diagnosis not present

## 2020-01-18 DIAGNOSIS — K222 Esophageal obstruction: Secondary | ICD-10-CM | POA: Diagnosis not present

## 2020-01-18 DIAGNOSIS — I110 Hypertensive heart disease with heart failure: Secondary | ICD-10-CM | POA: Diagnosis not present

## 2020-01-18 HISTORY — PX: BALLOON DILATION: SHX5330

## 2020-01-18 HISTORY — PX: ESOPHAGOGASTRODUODENOSCOPY (EGD) WITH PROPOFOL: SHX5813

## 2020-01-18 SURGERY — ESOPHAGOGASTRODUODENOSCOPY (EGD) WITH PROPOFOL
Anesthesia: Monitor Anesthesia Care

## 2020-01-18 MED ORDER — PROPOFOL 500 MG/50ML IV EMUL
INTRAVENOUS | Status: DC | PRN
  Administered 2020-01-18: 15 mg via INTRAVENOUS
  Administered 2020-01-18: 25 mg via INTRAVENOUS

## 2020-01-18 MED ORDER — EPHEDRINE SULFATE-NACL 50-0.9 MG/10ML-% IV SOSY
PREFILLED_SYRINGE | INTRAVENOUS | Status: DC | PRN
  Administered 2020-01-18: 5 mg via INTRAVENOUS

## 2020-01-18 MED ORDER — PROPOFOL 500 MG/50ML IV EMUL
INTRAVENOUS | Status: DC | PRN
  Administered 2020-01-18: 125 ug/kg/min via INTRAVENOUS

## 2020-01-18 MED ORDER — LACTATED RINGERS IV SOLN: INTRAVENOUS | Status: DC | PRN

## 2020-01-18 MED ORDER — LIDOCAINE HCL (CARDIAC) PF 100 MG/5ML IV SOSY
PREFILLED_SYRINGE | INTRAVENOUS | Status: DC | PRN
  Administered 2020-01-18: 50 mg via INTRATRACHEAL

## 2020-01-18 MED ORDER — SODIUM CHLORIDE 0.9 % IV SOLN: INTRAVENOUS | Status: DC

## 2020-01-18 SURGICAL SUPPLY — 14 items

## 2020-01-18 NOTE — H&P (Signed)
  Connie Obrien HPI: This 86 year old white female presents to the office for further evaluation of nausea and vomiting that started 2 years ago. The nausea has started to worsened over the last 3 weeks. She is not able to finish her meal without vomiting. She takes Dexilant daily for acid reflux. She feels the medication that she takes on a daily basis "are not staying down due to the nausea and vomiting". She was given antiemetics by Dr. Dema Severin but vomits shortly after taking it. She does not remember the name of the medication. She has several small volume BM's per day with no obvious blood or mucus in the stool. She occasionally has periumbilical pain that is relieved after a BM. She has a poor appetite and her weight has been stable. She denies having any complaints of dysphagia or odynophagia. She denies having a family history of colon cancer, celiac sprue or IBD. Her last colonoscopy done on 06/02/2009 revealed extensive diverticulosis and fragments of tubular adenomas were removed from the cecum.  Her esophagram shows a distal esophageal stricture and dysmotility.  Past Medical History:  Diagnosis Date  . Allergic rhinitis   . Asthma   . Deaf   . Diverticulosis   . GERD (gastroesophageal reflux disease)   . Glaucoma   . HTN (hypertension)   . IBS (irritable bowel syndrome)   . Obesity   . Osteoarthritis   . Osteopenia   . Scoliosis     Past Surgical History:  Procedure Laterality Date  . ABDOMINAL HERNIA REPAIR    . APPENDECTOMY    . BREAST BIOPSY    . CATARACT EXTRACTION    . COLON RESECTION    . HEMORRHOID SURGERY    . NISSEN FUNDOPLICATION    . TOTAL ABDOMINAL HYSTERECTOMY      Family History  Problem Relation Age of Onset  . Coronary artery disease Father   . Diabetes Father   . Coronary artery disease Mother   . Coronary artery disease Brother   . Prostate cancer Brother        x2    Social History:  reports that she has never smoked. She has never used  smokeless tobacco. She reports that she does not drink alcohol and does not use drugs.  Allergies:  Allergies  Allergen Reactions  . Flexeril [Cyclobenzaprine] Other (See Comments)    Hallucinations   . Vicodin [Hydrocodone-Acetaminophen] Other (See Comments)    Hallucinations   . Septra [Bactrim] Rash  . Sulfa Antibiotics Rash    Medications: Scheduled: Continuous:  No results found for this or any previous visit (from the past 24 hour(s)).   No results found.  ROS:  As stated above in the HPI otherwise negative.  There were no vitals taken for this visit.    PE: Gen: NAD, Alert and Oriented HEENT:  [place]/AT, EOMI Neck: Supple, no LAD Lungs: CTA Bilaterally CV: RRR without M/G/R ABD: Soft, NTND, +BS Ext: No C/C/E  Assessment/Plan: 1) Esophageal stricture. 2) Dysphagia.  Plan: 1) EGD with dilation.  [name] D 01/18/2020, 12:12 PM

## 2020-01-18 NOTE — Anesthesia Preprocedure Evaluation (Signed)
Anesthesia Evaluation  Patient identified by MRN, date of birth, ID band Patient confused    Reviewed: Patient's Chart, lab work & pertinent test results, reviewed documented beta blocker date and time   Airway Mallampati: II  TM Distance: >3 FB Neck ROM: Full    Dental  (+) Poor Dentition, Missing   Pulmonary asthma ,    Pulmonary exam normal        Cardiovascular hypertension, Pt. on home beta blockers and Pt. on medications  Rhythm:Regular Rate:Normal     Neuro/Psych negative neurological ROS  negative psych ROS   GI/Hepatic Neg liver ROS, GERD  Medicated,  Endo/Other  negative endocrine ROS  Renal/GU negative Renal ROS  negative genitourinary   Musculoskeletal  (+) Arthritis ,   Abdominal (+)  Abdomen: soft. Bowel sounds: normal.  Peds  Hematology negative hematology ROS (+)   Anesthesia Other Findings   Reproductive/Obstetrics                             Anesthesia Physical Anesthesia Plan  ASA: III  Anesthesia Plan: MAC   Post-op Pain Management:    Induction:   PONV Risk Score and Plan: 2 and Propofol infusion  Airway Management Planned: Simple Face Mask and Nasal Cannula  Additional Equipment: None  Intra-op Plan:   Post-operative Plan:   Informed Consent: I have reviewed the patients History and Physical, chart, labs and discussed the procedure including the risks, benefits and alternatives for the proposed anesthesia with the patient or authorized representative who has indicated his/her understanding and acceptance.     Dental advisory given  Plan Discussed with: CRNA  Anesthesia Plan Comments:         Anesthesia Quick Evaluation

## 2020-01-18 NOTE — Transfer of Care (Signed)
Immediate Anesthesia Transfer of Care Note  Patient: Loany E Cozart  Procedure(s) Performed: ESOPHAGOGASTRODUODENOSCOPY (EGD) WITH PROPOFOL (N/A ) BALLOON DILATION (N/A )  Patient Location: Endoscopy Unit  Anesthesia Type:MAC  Level of Consciousness: drowsy, patient cooperative and responds to stimulation  Airway & Oxygen Therapy: Patient Spontanous Breathing and Patient connected to face mask oxygen  Post-op Assessment: Report given to RN and Post -op Vital signs reviewed and stable  Post vital signs: Reviewed and stable  Last Vitals:  Vitals Value Taken Time  BP 113/51 01/18/20 1330  Temp    Pulse    Resp 12 01/18/20 1331  SpO2    Vitals shown include unvalidated device data.  Last Pain:  Vitals:   01/18/20 1223  TempSrc: Axillary  PainSc: 10-Worst pain ever         Complications: No complications documented.

## 2020-01-18 NOTE — Discharge Instructions (Signed)

## 2020-01-18 NOTE — Op Note (Signed)
[facility] [facility] Patient Name: Connie Obrien Procedure Date: 01/18/2020 MRN: [id number] Attending MD: Carol Ada , MD Date of Birth: February 06, 1920 CSN: [id number] Age: 86 Admit Type: Outpatient Procedure:                Upper GI endoscopy Indications:              Dysphagia Providers:                Carol Ada, MD, Particia Nearing, RN, Josie Dixon, RN, Ladona Ridgel, Technician Referring MD:              Medicines:                Propofol per Anesthesia Complications:            No immediate complications. Estimated Blood Loss:     Estimated blood loss: none. Procedure:                Pre-Anesthesia Assessment:                           - Prior to the procedure, a History and Physical                            was performed, and patient medications and                            allergies were reviewed. The patient's tolerance of                            previous anesthesia was also reviewed. The risks                            and benefits of the procedure and the sedation                            options and risks were discussed with the patient.                            All questions were answered, and informed consent                            was obtained. Prior Anticoagulants: The patient has                            taken no previous anticoagulant or antiplatelet                            agents. ASA Grade Assessment: III - A patient with                            severe systemic disease. After reviewing the risks  and benefits, the patient was deemed in                            satisfactory condition to undergo the procedure.                           - Sedation was administered by an anesthesia                            professional. Deep sedation was attained.                           After obtaining informed consent, the endoscope was                            passed under direct vision.  Throughout the                            procedure, the patient's blood pressure, pulse, and                            oxygen saturations were monitored continuously. The                            [id number]-H190 ([id number] Olympus gastroscope was                            introduced through the mouth, and advanced to the                            second part of duodenum. The upper GI endoscopy was                            accomplished without difficulty. The patient                            tolerated the procedure well. Scope In: Scope Out: Findings:      The esophagus was very torturous and in the distal 1/3 of the esophagus       the lumen was compromised. There was no evidence of a stricture, but the       esophagus was angulated at 33 cm. Proximal to this point there was       evidence of fluid stasisThe GE junction was measured to be approximately       36 cm. There was no obvious stricturing, but the esophagram findings of       a stricture were best viewed in the retroflexed position. The anatomic       position of the esophagus and/or stomach was abnormal. Dilation was       performed wiht a 10-12 mm balloon. No resistance was encoutered at any       of the diameters and it only proved the point that the mucosa in the GE       junction was compliant, but luminally distorted. There was no evidence       of any mucosal disruption. A clear  hiatal hernia with a paraesophageal       component was not endoscopically visible, but this was reported with the       esophagam. Estimated blood loss: none.      The stomach was normal.      The examined duodenum was normal. Impression:               - No endoscopic esophageal abnormality to explain                            patient's dysphagia. Esophagus dilated. Dilated.                           - Normal stomach.                           - Normal examined duodenum.                           - No specimens collected. Moderate  Sedation:      Not Applicable - Patient had care per Anesthesia. Recommendation:           - Patient has a contact number available for                            emergencies. The signs and symptoms of potential                            delayed complications were discussed with the                            patient. Return to normal activities tomorrow.                            Written discharge instructions were provided to the                            patient.                           - Pureed diet. Consume in small frequent quantities.                           - Continue present medications.                           - Return to GI office, Dr. Collene Mares, in 4 weeks.                           - ? Surgical evaluation/intervention. Procedure Code(s):        --- Professional ---                           [phone], Esophagogastroduodenoscopy, flexible,                            transoral; with transendoscopic balloon dilation of  esophagus (less than 30 mm diameter) Diagnosis Code(s):        --- Professional ---                           R13.10, Dysphagia, unspecified CPT copyright 2019 [employer]. All rights reserved. The codes documented in this report are preliminary and upon coder review may  be revised to meet current compliance requirements. Carol Ada, MD Carol Ada, MD 01/18/2020 1:39:29 PM This report has been signed electronically. Number of Addenda: 0

## 2020-01-18 NOTE — Anesthesia Postprocedure Evaluation (Signed)
Anesthesia Post Note  Patient: Connie Obrien  Procedure(s) Performed: ESOPHAGOGASTRODUODENOSCOPY (EGD) WITH PROPOFOL (N/A ) BALLOON DILATION (N/A )     Patient location during evaluation: Endoscopy Anesthesia Type: MAC Level of consciousness: awake and alert Pain management: pain level controlled Vital Signs Assessment: post-procedure vital signs reviewed and stable Respiratory status: spontaneous breathing, nonlabored ventilation, respiratory function stable and patient connected to nasal cannula oxygen Cardiovascular status: stable and blood pressure returned to baseline Postop Assessment: no apparent nausea or vomiting Anesthetic complications: no   No complications documented.  Last Vitals:  Vitals:   01/18/20 1400 01/18/20 1405  BP: (!) 144/57   Pulse: 72 65  Resp: (!) 21 14  Temp:    SpO2: 91% 92%    Last Pain:  Vitals:   01/18/20 1330  TempSrc: Axillary  PainSc: Asleep                 Belenda Cruise P Elwyn Klosinski

## 2020-01-20 ENCOUNTER — Encounter (HOSPITAL_COMMUNITY): Payer: Self-pay | Admitting: Gastroenterology

## 2020-01-22 DIAGNOSIS — H2000 Unspecified acute and subacute iridocyclitis: Secondary | ICD-10-CM | POA: Diagnosis not present

## 2020-01-22 DIAGNOSIS — M545 Low back pain, unspecified: Secondary | ICD-10-CM | POA: Diagnosis not present

## 2020-01-24 ENCOUNTER — Ambulatory Visit
Admission: RE | Admit: 2020-01-24 | Discharge: 2020-01-24 | Disposition: A | Discharge status: Home / Self Care | Payer: Medicare Other | Source: Ambulatory Visit | Attending: Physician Assistant | Admitting: Physician Assistant

## 2020-01-24 ENCOUNTER — Other Ambulatory Visit: Payer: Self-pay | Admitting: Physician Assistant

## 2020-01-24 ENCOUNTER — Other Ambulatory Visit: Payer: Self-pay

## 2020-01-24 DIAGNOSIS — I700 Atherosclerosis of aorta: Secondary | ICD-10-CM | POA: Diagnosis not present

## 2020-01-24 DIAGNOSIS — M5442 Lumbago with sciatica, left side: Secondary | ICD-10-CM | POA: Diagnosis not present

## 2020-01-24 DIAGNOSIS — M47816 Spondylosis without myelopathy or radiculopathy, lumbar region: Secondary | ICD-10-CM | POA: Diagnosis not present

## 2020-01-24 DIAGNOSIS — M2578 Osteophyte, vertebrae: Secondary | ICD-10-CM | POA: Diagnosis not present

## 2020-01-24 DIAGNOSIS — R600 Localized edema: Secondary | ICD-10-CM | POA: Diagnosis not present

## 2020-01-24 DIAGNOSIS — R233 Spontaneous ecchymoses: Secondary | ICD-10-CM | POA: Diagnosis not present

## 2020-01-24 DIAGNOSIS — M5441 Lumbago with sciatica, right side: Secondary | ICD-10-CM | POA: Diagnosis not present

## 2020-02-08 DIAGNOSIS — G459 Transient cerebral ischemic attack, unspecified: Secondary | ICD-10-CM | POA: Diagnosis not present

## 2020-02-08 DIAGNOSIS — E1169 Type 2 diabetes mellitus with other specified complication: Secondary | ICD-10-CM | POA: Diagnosis not present

## 2020-02-08 DIAGNOSIS — K219 Gastro-esophageal reflux disease without esophagitis: Secondary | ICD-10-CM | POA: Diagnosis not present

## 2020-02-08 DIAGNOSIS — H2000 Unspecified acute and subacute iridocyclitis: Secondary | ICD-10-CM | POA: Diagnosis not present

## 2020-02-08 DIAGNOSIS — I10 Essential (primary) hypertension: Secondary | ICD-10-CM | POA: Diagnosis not present

## 2020-02-08 DIAGNOSIS — I480 Paroxysmal atrial fibrillation: Secondary | ICD-10-CM | POA: Diagnosis not present

## 2020-02-08 DIAGNOSIS — D509 Iron deficiency anemia, unspecified: Secondary | ICD-10-CM | POA: Diagnosis not present

## 2020-02-08 DIAGNOSIS — E785 Hyperlipidemia, unspecified: Secondary | ICD-10-CM | POA: Diagnosis not present

## 2020-02-08 DIAGNOSIS — J4540 Moderate persistent asthma, uncomplicated: Secondary | ICD-10-CM | POA: Diagnosis not present

## 2020-02-08 DIAGNOSIS — Z23 Encounter for immunization: Secondary | ICD-10-CM | POA: Diagnosis not present

## 2020-03-22 DEATH — deceased
